# Patient Record
Sex: Female | Born: 1935
Health system: Southern US, Community
[De-identification: ages and names within clinical notes are randomized; demographics above are authoritative.]

## PROBLEM LIST (undated history)

## (undated) DIAGNOSIS — K219 Gastro-esophageal reflux disease without esophagitis: Secondary | ICD-10-CM

## (undated) DIAGNOSIS — I2510 Atherosclerotic heart disease of native coronary artery without angina pectoris: Secondary | ICD-10-CM

## (undated) DIAGNOSIS — G459 Transient cerebral ischemic attack, unspecified: Secondary | ICD-10-CM

## (undated) DIAGNOSIS — E7800 Pure hypercholesterolemia, unspecified: Secondary | ICD-10-CM

## (undated) DIAGNOSIS — E785 Hyperlipidemia, unspecified: Secondary | ICD-10-CM

## (undated) DIAGNOSIS — I6930 Unspecified sequelae of cerebral infarction: Secondary | ICD-10-CM

## (undated) DIAGNOSIS — M1990 Unspecified osteoarthritis, unspecified site: Secondary | ICD-10-CM

## (undated) DIAGNOSIS — I219 Acute myocardial infarction, unspecified: Secondary | ICD-10-CM

## (undated) DIAGNOSIS — E1121 Type 2 diabetes mellitus with diabetic nephropathy: Secondary | ICD-10-CM

## (undated) DIAGNOSIS — I10 Essential (primary) hypertension: Secondary | ICD-10-CM

## (undated) DIAGNOSIS — I639 Cerebral infarction, unspecified: Secondary | ICD-10-CM

## (undated) DIAGNOSIS — E119 Type 2 diabetes mellitus without complications: Secondary | ICD-10-CM

## (undated) DIAGNOSIS — I4891 Unspecified atrial fibrillation: Secondary | ICD-10-CM

## (undated) HISTORY — DX: Transient cerebral ischemic attack, unspecified: G45.9

## (undated) HISTORY — PX: HIP ARTHROPLASTY: SHX981

## (undated) HISTORY — DX: Unspecified sequelae of cerebral infarction: I69.30

## (undated) HISTORY — DX: Unspecified atrial fibrillation: I48.91

## (undated) HISTORY — DX: Type 2 diabetes mellitus with diabetic nephropathy: E11.21

## (undated) HISTORY — DX: Hyperlipidemia, unspecified: E78.5

## (undated) HISTORY — DX: Atherosclerotic heart disease of native coronary artery without angina pectoris: I25.10

## (undated) HISTORY — DX: Gastro-esophageal reflux disease without esophagitis: K21.9

---

## 2004-01-20 ENCOUNTER — Other Ambulatory Visit: Payer: Self-pay

## 2004-12-23 ENCOUNTER — Ambulatory Visit: Payer: Self-pay | Admitting: Unknown Physician Specialty

## 2005-03-11 ENCOUNTER — Inpatient Hospital Stay: Payer: Self-pay | Admitting: Internal Medicine

## 2005-05-04 ENCOUNTER — Ambulatory Visit: Payer: Self-pay | Admitting: Specialist

## 2005-05-16 ENCOUNTER — Inpatient Hospital Stay: Payer: Self-pay | Admitting: Internal Medicine

## 2005-06-13 ENCOUNTER — Ambulatory Visit: Payer: Self-pay | Admitting: Specialist

## 2005-06-30 ENCOUNTER — Ambulatory Visit: Payer: Self-pay | Admitting: Specialist

## 2005-07-11 ENCOUNTER — Ambulatory Visit: Payer: Self-pay | Admitting: Specialist

## 2005-07-19 ENCOUNTER — Ambulatory Visit: Payer: Self-pay | Admitting: Specialist

## 2005-08-02 ENCOUNTER — Ambulatory Visit: Payer: Self-pay | Admitting: Specialist

## 2006-11-13 ENCOUNTER — Ambulatory Visit: Payer: Self-pay | Admitting: Internal Medicine

## 2007-04-15 ENCOUNTER — Ambulatory Visit: Payer: Self-pay | Admitting: Specialist

## 2008-01-02 ENCOUNTER — Ambulatory Visit: Payer: Self-pay | Admitting: Internal Medicine

## 2009-01-12 ENCOUNTER — Ambulatory Visit: Payer: Self-pay | Admitting: Internal Medicine

## 2013-02-02 ENCOUNTER — Ambulatory Visit: Payer: Self-pay | Admitting: Physician Assistant

## 2013-02-04 ENCOUNTER — Ambulatory Visit: Payer: Self-pay | Admitting: Family Medicine

## 2014-07-10 DIAGNOSIS — E785 Hyperlipidemia, unspecified: Secondary | ICD-10-CM | POA: Insufficient documentation

## 2014-07-10 DIAGNOSIS — I10 Essential (primary) hypertension: Secondary | ICD-10-CM | POA: Insufficient documentation

## 2014-07-10 DIAGNOSIS — I2510 Atherosclerotic heart disease of native coronary artery without angina pectoris: Secondary | ICD-10-CM

## 2014-07-10 HISTORY — DX: Hyperlipidemia, unspecified: E78.5

## 2014-07-10 HISTORY — DX: Atherosclerotic heart disease of native coronary artery without angina pectoris: I25.10

## 2015-03-22 DIAGNOSIS — E119 Type 2 diabetes mellitus without complications: Secondary | ICD-10-CM | POA: Diagnosis not present

## 2015-03-29 DIAGNOSIS — E119 Type 2 diabetes mellitus without complications: Secondary | ICD-10-CM | POA: Diagnosis not present

## 2015-03-29 DIAGNOSIS — I10 Essential (primary) hypertension: Secondary | ICD-10-CM | POA: Diagnosis not present

## 2015-03-29 DIAGNOSIS — I2510 Atherosclerotic heart disease of native coronary artery without angina pectoris: Secondary | ICD-10-CM | POA: Diagnosis not present

## 2015-06-29 DIAGNOSIS — E119 Type 2 diabetes mellitus without complications: Secondary | ICD-10-CM | POA: Diagnosis not present

## 2015-07-05 DIAGNOSIS — E780 Pure hypercholesterolemia: Secondary | ICD-10-CM | POA: Diagnosis not present

## 2015-07-05 DIAGNOSIS — E119 Type 2 diabetes mellitus without complications: Secondary | ICD-10-CM | POA: Diagnosis not present

## 2015-07-05 DIAGNOSIS — I2510 Atherosclerotic heart disease of native coronary artery without angina pectoris: Secondary | ICD-10-CM | POA: Diagnosis not present

## 2015-07-05 DIAGNOSIS — I10 Essential (primary) hypertension: Secondary | ICD-10-CM | POA: Diagnosis not present

## 2015-07-05 DIAGNOSIS — K219 Gastro-esophageal reflux disease without esophagitis: Secondary | ICD-10-CM

## 2015-07-05 HISTORY — DX: Gastro-esophageal reflux disease without esophagitis: K21.9

## 2015-07-16 DIAGNOSIS — H26492 Other secondary cataract, left eye: Secondary | ICD-10-CM | POA: Diagnosis not present

## 2015-08-13 DIAGNOSIS — H26492 Other secondary cataract, left eye: Secondary | ICD-10-CM | POA: Diagnosis not present

## 2015-09-29 DIAGNOSIS — E119 Type 2 diabetes mellitus without complications: Secondary | ICD-10-CM | POA: Diagnosis not present

## 2015-10-06 DIAGNOSIS — I10 Essential (primary) hypertension: Secondary | ICD-10-CM | POA: Diagnosis not present

## 2015-10-06 DIAGNOSIS — E1340 Other specified diabetes mellitus with diabetic neuropathy, unspecified: Secondary | ICD-10-CM | POA: Diagnosis not present

## 2015-10-06 DIAGNOSIS — E119 Type 2 diabetes mellitus without complications: Secondary | ICD-10-CM | POA: Diagnosis not present

## 2015-10-06 DIAGNOSIS — Z23 Encounter for immunization: Secondary | ICD-10-CM | POA: Diagnosis not present

## 2015-10-06 DIAGNOSIS — I2510 Atherosclerotic heart disease of native coronary artery without angina pectoris: Secondary | ICD-10-CM | POA: Diagnosis not present

## 2016-01-07 DIAGNOSIS — E119 Type 2 diabetes mellitus without complications: Secondary | ICD-10-CM | POA: Diagnosis not present

## 2016-01-14 DIAGNOSIS — E1121 Type 2 diabetes mellitus with diabetic nephropathy: Secondary | ICD-10-CM

## 2016-01-14 DIAGNOSIS — E7800 Pure hypercholesterolemia, unspecified: Secondary | ICD-10-CM | POA: Diagnosis not present

## 2016-01-14 DIAGNOSIS — I10 Essential (primary) hypertension: Secondary | ICD-10-CM | POA: Diagnosis not present

## 2016-01-14 DIAGNOSIS — Z0001 Encounter for general adult medical examination with abnormal findings: Secondary | ICD-10-CM | POA: Diagnosis not present

## 2016-01-14 DIAGNOSIS — E1140 Type 2 diabetes mellitus with diabetic neuropathy, unspecified: Secondary | ICD-10-CM | POA: Insufficient documentation

## 2016-01-14 DIAGNOSIS — N179 Acute kidney failure, unspecified: Secondary | ICD-10-CM | POA: Diagnosis not present

## 2016-01-14 DIAGNOSIS — I2510 Atherosclerotic heart disease of native coronary artery without angina pectoris: Secondary | ICD-10-CM | POA: Diagnosis not present

## 2016-01-14 DIAGNOSIS — E875 Hyperkalemia: Secondary | ICD-10-CM | POA: Diagnosis not present

## 2016-01-14 HISTORY — DX: Type 2 diabetes mellitus with diabetic nephropathy: E11.21

## 2016-01-21 DIAGNOSIS — E1121 Type 2 diabetes mellitus with diabetic nephropathy: Secondary | ICD-10-CM | POA: Diagnosis not present

## 2016-03-27 ENCOUNTER — Emergency Department: Payer: Commercial Managed Care - HMO

## 2016-03-27 ENCOUNTER — Inpatient Hospital Stay
Admission: EM | Admit: 2016-03-27 | Discharge: 2016-03-29 | DRG: 065 | Disposition: A | Discharge status: Home Health | Payer: Commercial Managed Care - HMO | Attending: Internal Medicine | Admitting: Internal Medicine

## 2016-03-27 ENCOUNTER — Encounter: Payer: Self-pay | Admitting: Emergency Medicine

## 2016-03-27 DIAGNOSIS — I4891 Unspecified atrial fibrillation: Secondary | ICD-10-CM | POA: Diagnosis not present

## 2016-03-27 DIAGNOSIS — I63412 Cerebral infarction due to embolism of left middle cerebral artery: Secondary | ICD-10-CM | POA: Diagnosis not present

## 2016-03-27 DIAGNOSIS — R29701 NIHSS score 1: Secondary | ICD-10-CM | POA: Diagnosis not present

## 2016-03-27 DIAGNOSIS — R531 Weakness: Secondary | ICD-10-CM | POA: Diagnosis not present

## 2016-03-27 DIAGNOSIS — I10 Essential (primary) hypertension: Secondary | ICD-10-CM | POA: Diagnosis not present

## 2016-03-27 DIAGNOSIS — E7800 Pure hypercholesterolemia, unspecified: Secondary | ICD-10-CM | POA: Diagnosis present

## 2016-03-27 DIAGNOSIS — G459 Transient cerebral ischemic attack, unspecified: Secondary | ICD-10-CM

## 2016-03-27 DIAGNOSIS — Z96649 Presence of unspecified artificial hip joint: Secondary | ICD-10-CM | POA: Diagnosis present

## 2016-03-27 DIAGNOSIS — Z8249 Family history of ischemic heart disease and other diseases of the circulatory system: Secondary | ICD-10-CM

## 2016-03-27 DIAGNOSIS — M1990 Unspecified osteoarthritis, unspecified site: Secondary | ICD-10-CM | POA: Diagnosis present

## 2016-03-27 DIAGNOSIS — Z833 Family history of diabetes mellitus: Secondary | ICD-10-CM

## 2016-03-27 DIAGNOSIS — Z79899 Other long term (current) drug therapy: Secondary | ICD-10-CM

## 2016-03-27 DIAGNOSIS — E860 Dehydration: Secondary | ICD-10-CM | POA: Diagnosis not present

## 2016-03-27 DIAGNOSIS — E119 Type 2 diabetes mellitus without complications: Secondary | ICD-10-CM | POA: Diagnosis present

## 2016-03-27 DIAGNOSIS — G8191 Hemiplegia, unspecified affecting right dominant side: Secondary | ICD-10-CM | POA: Diagnosis present

## 2016-03-27 DIAGNOSIS — I63332 Cerebral infarction due to thrombosis of left posterior cerebral artery: Secondary | ICD-10-CM | POA: Diagnosis not present

## 2016-03-27 DIAGNOSIS — I63532 Cerebral infarction due to unspecified occlusion or stenosis of left posterior cerebral artery: Principal | ICD-10-CM | POA: Diagnosis present

## 2016-03-27 DIAGNOSIS — M79604 Pain in right leg: Secondary | ICD-10-CM | POA: Diagnosis not present

## 2016-03-27 DIAGNOSIS — E785 Hyperlipidemia, unspecified: Secondary | ICD-10-CM | POA: Diagnosis not present

## 2016-03-27 DIAGNOSIS — I6523 Occlusion and stenosis of bilateral carotid arteries: Secondary | ICD-10-CM | POA: Diagnosis not present

## 2016-03-27 DIAGNOSIS — Z7984 Long term (current) use of oral hypoglycemic drugs: Secondary | ICD-10-CM | POA: Diagnosis not present

## 2016-03-27 HISTORY — DX: Unspecified osteoarthritis, unspecified site: M19.90

## 2016-03-27 HISTORY — DX: Type 2 diabetes mellitus without complications: E11.9

## 2016-03-27 HISTORY — DX: Essential (primary) hypertension: I10

## 2016-03-27 HISTORY — DX: Pure hypercholesterolemia, unspecified: E78.00

## 2016-03-27 LAB — BASIC METABOLIC PANEL
ANION GAP: 7 (ref 5–15)
BUN: 50 mg/dL — ABNORMAL HIGH (ref 6–20)
CHLORIDE: 108 mmol/L (ref 101–111)
CO2: 24 mmol/L (ref 22–32)
CREATININE: 1.73 mg/dL — AB (ref 0.44–1.00)
Calcium: 9.5 mg/dL (ref 8.9–10.3)
GFR calc non Af Amer: 25 mL/min — ABNORMAL LOW (ref >60)
GFR, EST AFRICAN AMERICAN: 29 mL/min — AB (ref >60)
Glucose, Bld: 174 mg/dL — ABNORMAL HIGH (ref 65–99)
Potassium: 3.6 mmol/L (ref 3.5–5.1)
SODIUM: 139 mmol/L (ref 135–145)

## 2016-03-27 LAB — CBC
HCT: 35.3 % (ref 35.0–47.0)
HEMOGLOBIN: 11.9 g/dL — AB (ref 12.0–16.0)
MCH: 29.8 pg (ref 26.0–34.0)
MCHC: 33.8 g/dL (ref 32.0–36.0)
MCV: 88.4 fL (ref 80.0–100.0)
PLATELETS: 213 10*3/uL (ref 150–440)
RBC: 4 MIL/uL (ref 3.80–5.20)
RDW: 13.8 % (ref 11.5–14.5)
WBC: 9.7 10*3/uL (ref 3.6–11.0)

## 2016-03-27 LAB — APTT: APTT: 35 s (ref 24–36)

## 2016-03-27 LAB — PROTIME-INR
INR: 1.01
Prothrombin Time: 13.5 seconds (ref 11.4–15.0)

## 2016-03-27 NOTE — ED Provider Notes (Signed)
[facility] Emergency Department Provider Note  ____________________________________________  Time seen: Approximately 11:06 PM  I have reviewed the triage vital signs and the nursing notes.   HISTORY  Chief Complaint Extremity Weakness    HPI Robin Morales is a 82 y.o. female with a past medical history that includes diabetes, hypertension, hyperlipidemia as well as a prior right sided hip surgerywho presents approximately 2 hours after acute onset of right-sided weakness and numbness.  She reports that she was watching TV with when she tried to get up to get ready for bed and discover that she could not move her right arm or her right leg.  She reports that it felt like "nothing was there" and that she was not able to move the right leg at all and could only minimally move the right arm.  He did fall over his results but her daughter caught her and helped her.  Over a short period of time (minutes) the patient's symptoms began improving.  By the time EMS arrived her right upper extremity felt normal but the right lower extremity was still slightly weak and numb.  Currently in the emergency department she said that below the knee down feels just a very tiny bit numb but otherwise feels normal and she can move it again at her baseline.  She denies headache, shortness of breath, fever/chills, chest pain, shortness of breath, abdominal pain, dysuria, nausea/vomiting/diarrhea.  She has no history of cardiac arrhythmia, no history of CVA/TIA, and takes no blood thinners.    Past Medical History  Diagnosis Date  . Diabetes mellitus without complication ([place])   . Hypertension   . Arthritis   . Hypercholesterolemia     Patient Active Problem List   Diagnosis Date Noted  . A-fib ([place]) 03/28/2016  . TIA (transient ischemic attack) 03/28/2016    Past Surgical History  Procedure Laterality Date  . Hip arthroplasty      No current outpatient prescriptions on  file.  Allergies Review of patient's allergies indicates no known allergies.  Family History  Problem Relation Age of Onset  . Hypertension Daughter   . Diabetes Sister     Social History Social History  Substance Use Topics  . Smoking status: Never Smoker   . Smokeless tobacco: None  . Alcohol Use: No    Review of Systems Constitutional: No fever/chills Eyes: No visual changes. ENT: No sore throat. Cardiovascular: Denies chest pain. Respiratory: Denies shortness of breath. Gastrointestinal: No abdominal pain.  No nausea, no vomiting.  No diarrhea.  No constipation. Genitourinary: Negative for dysuria. Musculoskeletal: Negative for back pain. Skin: Negative for rash. Neurological: Negative for headaches.  Numbness and weakness in the right upper extremity as well as the right lower extremity which is significantly improved to only be slight numbness with normal movement of the right lower extremity below the knee Psychiatric:normal  10-point ROS otherwise negative.  ____________________________________________   PHYSICAL EXAM:  VITAL SIGNS: ED Triage Vitals  Enc Vitals Group     BP 03/27/16 2238 183/74 mmHg     Pulse Rate 03/27/16 2238 78     Resp 03/27/16 2238 20     Temp 03/27/16 2238 98.3 F (36.8 C)     Temp Source 03/27/16 2238 Oral     SpO2 03/27/16 2238 94 %     Weight --      Height --      Head Cir --      Peak Flow --  Pain Score 03/27/16 2216 0     Pain Loc --      Pain Edu? --      Excl. in [place]? --     Constitutional: Alert and oriented. Well appearing and in no acute distress. Eyes: Conjunctivae are normal. PERRL. EOMI. Head: Atraumatic. Nose: No congestion/rhinnorhea. Mouth/Throat: Mucous membranes are moist.  Oropharynx non-erythematous. Neck: No stridor.  No meningeal signs.   Cardiovascular: Normal rate, irregular rhythm. Good peripheral circulation. Grossly normal heart sounds.   Respiratory: Normal respiratory effort.  No  retractions. Lungs CTAB. Gastrointestinal: Soft and nontender. No distention.  Musculoskeletal: No lower extremity tenderness nor edema. No gross deformities of extremities. Neurologic:  Normal speech and language. The patient has limited range of motion on the right compared to the left due to her right hip replacement but her daughters and the patient herself reports that she is currently at her baseline.  Gait not tested due to the patient's subjective instability.  She has no gross cranial nerve deficits including no aphasia, no facial droop, no numbness or tingling on the face. Skin:  Skin is warm, dry and intact. No rash noted. Psychiatric: Mood and affect are normal. Speech and behavior are normal.  ____________________________________________   LABS (all labs ordered are listed, but only abnormal results are displayed)  Labs Reviewed  BASIC METABOLIC PANEL - Abnormal; Notable for the following:    Glucose, Bld 174 (*)    BUN 50 (*)    Creatinine, Ser 1.73 (*)    GFR calc non Af Amer 25 (*)    GFR calc Af Amer 29 (*)    All other components within normal limits  CBC - Abnormal; Notable for the following:    Hemoglobin 11.9 (*)    All other components within normal limits  URINALYSIS COMPLETEWITH MICROSCOPIC ([facility] ONLY) - Abnormal; Notable for the following:    Color, Urine STRAW (*)    APPearance CLEAR (*)    Leukocytes, UA TRACE (*)    Bacteria, UA FEW (*)    Squamous Epithelial / LPF 0-5 (*)    All other components within normal limits  GLUCOSE, CAPILLARY - Abnormal; Notable for the following:    Glucose-Capillary 113 (*)    All other components within normal limits  APTT  PROTIME-INR  TROPONIN I  HEPARIN LEVEL (UNFRACTIONATED)  HEMOGLOBIN A1C  LIPID PANEL   ____________________________________________  EKG  ED ECG REPORT I, Malky Rudzinski, the attending physician, personally viewed and interpreted this ECG.   Date: 03/27/2016  EKG Time: 22:41  Rate: 81   Rhythm: atrial fibrillation, rate 81  Axis: normal  Intervals:normal  ST&T Change: No evidence of acute ischemia  ____________________________________________  RADIOLOGY   Ct Head Wo Contrast  03/27/2016  CLINICAL DATA:  Right-sided weakness. EXAM: CT HEAD WITHOUT CONTRAST TECHNIQUE: Contiguous axial images were obtained from the base of the skull through the vertex without intravenous contrast. COMPARISON:  None. FINDINGS: The brain demonstrates no evidence of hemorrhage, infarction, edema, mass effect, extra-axial fluid collection, hydrocephalus or mass lesion. The skull is unremarkable. IMPRESSION: Normal head CT.  No acute findings. Electronically Signed   By: Aletta Edouard M.D.   On: 03/27/2016 23:55    ____________________________________________   PROCEDURES  Procedure(s) performed: None  Critical Care performed: No ____________________________________________   INITIAL IMPRESSION / ASSESSMENT AND PLAN / ED COURSE  Pertinent labs & imaging results that were available during my care of the patient were reviewed by me and considered in my medical  decision making (see chart for details).  NIH Stroke Scale  Interval: Baseline Time: 11:31 PM Person Administering Scale: Lenord Fralix  Administer stroke scale items in the order listed. Record performance in each category after each subscale exam. Do not go back and change scores. Follow directions provided for each exam technique. Scores should reflect what the patient does, not what the clinician thinks the patient can do. The clinician should record answers while administering the exam and work quickly. Except where indicated, the patient should not be coached (i.e., repeated requests to patient to make a special effort).   1a  Level of consciousness: 0=alert; keenly responsive  1b. LOC questions:  0=Performs both tasks correctly  1c. LOC commands: 0=Performs both tasks correctly  2.  Best Gaze: 0=normal  3.  Visual:  0=No visual loss  4. Facial Palsy: 0=Normal symmetric movement  5a.  Motor left arm: 0=No drift, limb holds 90 (or 45) degrees for full 10 seconds  5b.  Motor right arm: 0=No drift, limb holds 90 (or 45) degrees for full 10 seconds  6a. motor left leg: 0=No drift, limb holds 90 (or 45) degrees for full 10 seconds  6b  Motor right leg:  1=Drift, limb holds 90 (or 45) degrees but drifts down before full 10 seconds: does not hit bed  7. Limb Ataxia: 0=Absent  8.  Sensory: 0=Normal; no sensory loss  9. Best Language:  0=No aphasia, normal  10. Dysarthria: 0=Normal  11. Extinction and Inattention: 0=No abnormality  12. Distal motor function: 0=Normal   Total:   1  Note:  I believe that the patient's right motor deficit is due to chronic issues with a hip replacement rather than a new neurological deficit.  The patient presented I EMS with an NIH stroke scale of 0-1 according to the nurses and given the minimal symptoms was not made a code stroke.  After I evaluated her immediately upon my arrival for the start of my shift and got the full history, I do believe that she had a CVA/TIA but she currently has no acute deficit and she improved rapidly.  This is a contraindication for TPA and I do not believe she would be a good candidate for it.  I discussed this with the daughters and explained why do not think it is necessary to call her a "code stroke" or even discuss tPA further.  I explained the risks and benefits and they completely understand and agree with my assessment.   (Note that documentation was delayed due to multiple ED patients requiring immediate care.)   CT was unremarkable.  I admitted the patient to the hospitalist for TIA and stroke risk assessment.  Her neurological symptoms have completely resolved at this time.   ____________________________________________  FINAL CLINICAL IMPRESSION(S) / ED DIAGNOSES  Final diagnoses:  Transient cerebral ischemia, unspecified transient  cerebral ischemia type  TIA (transient ischemic attack)      NEW MEDICATIONS STARTED DURING THIS VISIT:  Current Discharge Medication List        Note:  This document was prepared using Dragon voice recognition software and may include unintentional dictation errors.   Hinda Kehr, MD 03/28/16 [phone]

## 2016-03-27 NOTE — ED Notes (Addendum)
Pt from home via EMS for right sided weakness that began about 2100. Pt states she has right less numbness below the knee now when she tried to get up tonight. 208/120 bp for EMS. Pt denies headache,n/v. Pt A&O . Hx of MI

## 2016-03-27 NOTE — ED Notes (Signed)
Pt family pt was having right sided numbness from shoulder down to RLE.

## 2016-03-28 ENCOUNTER — Inpatient Hospital Stay
Admit: 2016-03-28 | Discharge: 2016-03-28 | Disposition: A | Discharge status: Home / Self Care | Payer: Commercial Managed Care - HMO | Attending: Internal Medicine | Admitting: Internal Medicine

## 2016-03-28 ENCOUNTER — Inpatient Hospital Stay: Payer: Commercial Managed Care - HMO

## 2016-03-28 ENCOUNTER — Encounter: Payer: Self-pay | Admitting: Internal Medicine

## 2016-03-28 DIAGNOSIS — G8191 Hemiplegia, unspecified affecting right dominant side: Secondary | ICD-10-CM | POA: Diagnosis present

## 2016-03-28 DIAGNOSIS — I10 Essential (primary) hypertension: Secondary | ICD-10-CM | POA: Diagnosis present

## 2016-03-28 DIAGNOSIS — Z833 Family history of diabetes mellitus: Secondary | ICD-10-CM | POA: Diagnosis not present

## 2016-03-28 DIAGNOSIS — I63532 Cerebral infarction due to unspecified occlusion or stenosis of left posterior cerebral artery: Secondary | ICD-10-CM | POA: Diagnosis present

## 2016-03-28 DIAGNOSIS — E785 Hyperlipidemia, unspecified: Secondary | ICD-10-CM | POA: Diagnosis present

## 2016-03-28 DIAGNOSIS — Z96649 Presence of unspecified artificial hip joint: Secondary | ICD-10-CM | POA: Diagnosis present

## 2016-03-28 DIAGNOSIS — I4891 Unspecified atrial fibrillation: Secondary | ICD-10-CM | POA: Diagnosis present

## 2016-03-28 DIAGNOSIS — Z8249 Family history of ischemic heart disease and other diseases of the circulatory system: Secondary | ICD-10-CM | POA: Diagnosis not present

## 2016-03-28 DIAGNOSIS — Z7984 Long term (current) use of oral hypoglycemic drugs: Secondary | ICD-10-CM | POA: Diagnosis not present

## 2016-03-28 DIAGNOSIS — G459 Transient cerebral ischemic attack, unspecified: Secondary | ICD-10-CM | POA: Diagnosis not present

## 2016-03-28 DIAGNOSIS — E7800 Pure hypercholesterolemia, unspecified: Secondary | ICD-10-CM | POA: Diagnosis present

## 2016-03-28 DIAGNOSIS — E860 Dehydration: Secondary | ICD-10-CM | POA: Diagnosis present

## 2016-03-28 DIAGNOSIS — Z79899 Other long term (current) drug therapy: Secondary | ICD-10-CM | POA: Diagnosis not present

## 2016-03-28 DIAGNOSIS — E119 Type 2 diabetes mellitus without complications: Secondary | ICD-10-CM | POA: Diagnosis present

## 2016-03-28 DIAGNOSIS — M1990 Unspecified osteoarthritis, unspecified site: Secondary | ICD-10-CM | POA: Diagnosis present

## 2016-03-28 HISTORY — DX: Unspecified atrial fibrillation: I48.91

## 2016-03-28 HISTORY — DX: Transient cerebral ischemic attack, unspecified: G45.9

## 2016-03-28 LAB — GLUCOSE, CAPILLARY
Glucose-Capillary: 113 mg/dL — ABNORMAL HIGH (ref 65–99)
Glucose-Capillary: 103 mg/dL — ABNORMAL HIGH (ref 65–99)
Glucose-Capillary: 93 mg/dL (ref 65–99)
Glucose-Capillary: 112 mg/dL — ABNORMAL HIGH (ref 65–99)

## 2016-03-28 LAB — HEPARIN LEVEL (UNFRACTIONATED)

## 2016-03-28 LAB — HEMOGLOBIN A1C: Hgb A1c MFr Bld: 5.8 % (ref 4.0–6.0)

## 2016-03-28 LAB — URINALYSIS COMPLETE WITH MICROSCOPIC (ARMC ONLY)
Bilirubin Urine: NEGATIVE
Glucose, UA: NEGATIVE mg/dL
HGB URINE DIPSTICK: NEGATIVE
Ketones, ur: NEGATIVE mg/dL
Nitrite: NEGATIVE
PROTEIN: NEGATIVE mg/dL
SPECIFIC GRAVITY, URINE: 1.01 (ref 1.005–1.030)
pH: 6 (ref 5.0–8.0)

## 2016-03-28 LAB — TROPONIN I

## 2016-03-28 LAB — LIPID PANEL
CHOL/HDL RATIO: 3.3 ratio
CHOLESTEROL: 163 mg/dL (ref 0–200)
HDL: 50 mg/dL (ref >40)
LDL CALC: 82 mg/dL (ref 0–99)
TRIGLYCERIDES: 156 mg/dL — AB (ref <150)
VLDL: 31 mg/dL (ref 0–40)

## 2016-03-28 MED ORDER — INSULIN ASPART 100 UNIT/ML ~~LOC~~ SOLN: 0.0000 [IU] | Freq: Every day | SUBCUTANEOUS | Status: DC

## 2016-03-28 MED ORDER — APIXABAN 5 MG PO TABS
2.5000 mg | ORAL_TABLET | Freq: Two times a day (BID) | ORAL | Status: DC
  Administered 2016-03-28: 23:00:00 2.5 mg via ORAL
  Filled 2016-03-28: qty 1

## 2016-03-28 MED ORDER — SODIUM BICARBONATE 650 MG PO TABS
325.0000 mg | ORAL_TABLET | Freq: Four times a day (QID) | ORAL | Status: DC
  Administered 2016-03-28 – 2016-03-29 (×4): 325 mg via ORAL
  Filled 2016-03-28 (×4): qty 1

## 2016-03-28 MED ORDER — DEXTROSE-NACL 5-0.9 % IV SOLN: INTRAVENOUS | Status: DC

## 2016-03-28 MED ORDER — ASPIRIN 81 MG PO CHEW
324.0000 mg | CHEWABLE_TABLET | Freq: Once | ORAL | Status: AC
  Administered 2016-03-28: 324 mg via ORAL
  Filled 2016-03-28: qty 4

## 2016-03-28 MED ORDER — INSULIN ASPART 100 UNIT/ML ~~LOC~~ SOLN: 0.0000 [IU] | Freq: Three times a day (TID) | SUBCUTANEOUS | Status: DC

## 2016-03-28 MED ORDER — ACETAMINOPHEN 325 MG PO TABS: 650.0000 mg | ORAL_TABLET | ORAL | Status: DC | PRN

## 2016-03-28 MED ORDER — ASPIRIN 300 MG RE SUPP: 300.0000 mg | Freq: Every day | RECTAL | Status: DC

## 2016-03-28 MED ORDER — SODIUM CHLORIDE 0.9 % IV SOLN
INTRAVENOUS | Status: DC
  Administered 2016-03-28: 1000 mL via INTRAVENOUS
  Administered 2016-03-28 – 2016-03-29 (×2): via INTRAVENOUS

## 2016-03-28 MED ORDER — ACETAMINOPHEN 650 MG RE SUPP: 650.0000 mg | RECTAL | Status: DC | PRN

## 2016-03-28 MED ORDER — HEPARIN (PORCINE) IN NACL 100-0.45 UNIT/ML-% IJ SOLN
1000.0000 [IU]/h | INTRAMUSCULAR | Status: DC
  Administered 2016-03-28: 1000 [IU]/h via INTRAVENOUS
  Filled 2016-03-28: qty 250

## 2016-03-28 MED ORDER — STROKE: EARLY STAGES OF RECOVERY BOOK
Freq: Once | Status: AC
  Administered 2016-03-28: 14:00:00

## 2016-03-28 MED ORDER — METOPROLOL TARTRATE 25 MG PO TABS
25.0000 mg | ORAL_TABLET | Freq: Two times a day (BID) | ORAL | Status: DC
  Administered 2016-03-28 – 2016-03-29 (×2): 25 mg via ORAL
  Filled 2016-03-28 (×2): qty 1

## 2016-03-28 MED ORDER — ATORVASTATIN CALCIUM 20 MG PO TABS
40.0000 mg | ORAL_TABLET | Freq: Every day | ORAL | Status: DC
  Administered 2016-03-29: 09:00:00 40 mg via ORAL
  Filled 2016-03-28: qty 2

## 2016-03-28 MED ORDER — ASPIRIN 325 MG PO TABS
325.0000 mg | ORAL_TABLET | Freq: Every day | ORAL | Status: DC
  Administered 2016-03-28: 14:00:00 325 mg via ORAL
  Filled 2016-03-28: qty 1

## 2016-03-28 MED ORDER — SENNOSIDES-DOCUSATE SODIUM 8.6-50 MG PO TABS: 1.0000 | ORAL_TABLET | Freq: Every evening | ORAL | Status: DC | PRN

## 2016-03-28 MED ORDER — SIMVASTATIN 20 MG PO TABS
20.0000 mg | ORAL_TABLET | Freq: Every day | ORAL | Status: DC
  Administered 2016-03-28: 20 mg via ORAL
  Filled 2016-03-28: qty 1

## 2016-03-28 MED ORDER — PANTOPRAZOLE SODIUM 40 MG PO TBEC
40.0000 mg | DELAYED_RELEASE_TABLET | Freq: Every day | ORAL | Status: DC
  Administered 2016-03-28 – 2016-03-29 (×2): 40 mg via ORAL
  Filled 2016-03-28 (×2): qty 1

## 2016-03-28 NOTE — Progress Notes (Signed)
[place] [facility] [place] NAME: Robin Morales    MR#:  [id number]  DATE OF BIRTH:  01-11-1926  SUBJECTIVE:  CHIEF COMPLAINT:   Chief Complaint  Patient presents with  . Extremity Weakness  no residual weakness, all family at bedside. REVIEW OF SYSTEMS:  Review of Systems  Constitutional: Negative for fever, weight loss, malaise/fatigue and diaphoresis.  HENT: Negative for ear discharge, ear pain, hearing loss, nosebleeds, sore throat and tinnitus.   Eyes: Negative for blurred vision and pain.  Respiratory: Negative for cough, hemoptysis, shortness of breath and wheezing.   Cardiovascular: Negative for chest pain, palpitations, orthopnea and leg swelling.  Gastrointestinal: Negative for heartburn, nausea, vomiting, abdominal pain, diarrhea, constipation and blood in stool.  Genitourinary: Negative for dysuria, urgency and frequency.  Musculoskeletal: Negative for myalgias and back pain.  Skin: Negative for itching and rash.  Neurological: Negative for dizziness, tingling, tremors, focal weakness, seizures, weakness and headaches.  Psychiatric/Behavioral: Negative for depression. The patient is not nervous/anxious.     DRUG ALLERGIES:  No Known Allergies VITALS:  Blood pressure 166/59, pulse 84, temperature 97.6 F (36.4 C), temperature source Oral, resp. rate 18, height 5\' 1"  (1.549 m), weight 81.149 kg (178 lb 14.4 oz), SpO2 96 %. PHYSICAL EXAMINATION:  Physical Exam  Constitutional: She is oriented to person, place, and time and well-developed, well-nourished, and in no distress.  HENT:  Head: Normocephalic and atraumatic.  Eyes: Conjunctivae and EOM are normal. Pupils are equal, round, and reactive to light.  Neck: Normal range of motion. Neck supple. No tracheal deviation present. No thyromegaly present.  Cardiovascular: Normal rate, regular rhythm and normal heart sounds.   Pulmonary/Chest: Effort normal and breath sounds  normal. No respiratory distress. She has no wheezes. She exhibits no tenderness.  Abdominal: Soft. Bowel sounds are normal. She exhibits no distension. There is no tenderness.  Musculoskeletal: Normal range of motion.  Neurological: She is alert and oriented to person, place, and time. No cranial nerve deficit.  Skin: Skin is warm and dry. No rash noted.  Psychiatric: Mood and affect normal.   LABORATORY PANEL:   CBC  Recent Labs Lab 03/27/16 2231  WBC 9.7  HGB 11.9*  HCT 35.3  PLT 213   ------------------------------------------------------------------------------------------------------------------ Chemistries   Recent Labs Lab 03/27/16 2231  NA 139  K 3.6  CL 108  CO2 24  GLUCOSE 174*  BUN 50*  CREATININE 1.73*  CALCIUM 9.5   RADIOLOGY:  Ct Head Wo Contrast  03/27/2016  CLINICAL DATA:  Right-sided weakness. EXAM: CT HEAD WITHOUT CONTRAST TECHNIQUE: Contiguous axial images were obtained from the base of the skull through the vertex without intravenous contrast. COMPARISON:  None. FINDINGS: The brain demonstrates no evidence of hemorrhage, infarction, edema, mass effect, extra-axial fluid collection, hydrocephalus or mass lesion. The skull is unremarkable. IMPRESSION: Normal head CT.  No acute findings. Electronically Signed   By: Aletta Edouard M.D.   On: 03/27/2016 23:55   Mr Brain Wo Contrast  03/28/2016  ADDENDUM REPORT: 03/28/2016 13:48 ADDENDUM: Study discussed by telephone with Neurology Dr. Manuella Ghazi On 03/28/2016 at 1335 hours. Electronically Signed   By: Genevie Ann M.D.   On: 03/28/2016 13:48  03/28/2016  CLINICAL DATA:  82 year old female with right side weakness since 2100 hours yesterday. Initial encounter. EXAM: MRI HEAD WITHOUT CONTRAST MRA HEAD WITHOUT CONTRAST TECHNIQUE: Multiplanar, multiecho pulse sequences of the brain and surrounding structures were obtained without intravenous contrast. Angiographic images of the head were  obtained using MRA technique without  contrast. COMPARISON:  Head CT without contrast 03/27/2016. FINDINGS: MRI HEAD FINDINGS Cerebral volume is within normal limits for age. There are 1 or 2 subtle small areas of restricted diffusion in the left occipital lobe, most notably the medial left PCA territory on series 5, image 20. No other restricted diffusion. Major intracranial vascular flow voids are within normal limits. Minimal to mild for age nonspecific cerebral white matter T2 and FLAIR hyperintensity. No cortical encephalomalacia. No definite chronic cerebral blood products. No midline shift, mass effect, evidence of mass lesion, ventriculomegaly, extra-axial collection or acute intracranial hemorrhage. Cervicomedullary junction and pituitary are within normal limits. Visible internal auditory structures appear normal. Mastoids are clear. Trace paranasal sinus mucosal thickening. Postoperative changes to the globes. Otherwise negative orbit and scalp soft tissues. MRA HEAD FINDINGS Antegrade flow in the posterior circulation with no distal vertebral artery stenosis. Normal PICA origins and vertebrobasilar junction. No basilar stenosis. Normal SCA and PCA origins. Suggestion of very distal left PCA flow attenuation (P3 branches). An this may correspond to an area of asymmetric T2 * signal in the left occipital lobe on series 15, image 12. Right PCA branches are within normal limits. Posterior communicating arteries are diminutive or absent. Antegrade flow in both ICA siphons moderate irregularity in both cavernous and supraclinoid ICA segments with up to mild associated stenosis. Ophthalmic artery origins remain normal. Carotid termini remain patent. MCA and ACA origins are within normal limits. Anterior communicating artery and visualized bilateral ACA branches are within normal limits. Right MCA M1 segment and visualized right MCA branches are within normal limits. Left MCA M1 segment and visualized left MCA branches are within normal limits aside  from mild irregularity at the left MCA bifurcation. IMPRESSION: 1. Small acute left PCA territory infarcts associated with distal (P3) left PCA stenosis or occlusion on MRA. 2. No associated hemorrhage or mass effect. 3. Otherwise negative for age noncontrast MRI appearance of the brain. 4. The remaining posterior circulation is negative on MRA. The anterior circulation is remarkable for moderate ICA siphon and mild left MCA atherosclerosis. Electronically Signed: By: Genevie Ann M.D. On: 03/28/2016 13:26   US Carotid Bilateral  03/28/2016  CLINICAL DATA:  TIA.  Right-sided weakness yesterday. EXAM: BILATERAL CAROTID DUPLEX ULTRASOUND TECHNIQUE: Pearline Cables scale imaging, color Doppler and duplex ultrasound were performed of bilateral carotid and vertebral arteries in the neck. COMPARISON:  None. FINDINGS: Criteria: Quantification of carotid stenosis is based on velocity parameters that correlate the residual internal carotid diameter with NASCET-based stenosis levels, using the diameter of the distal internal carotid lumen as the denominator for stenosis measurement. The following velocity measurements were obtained: RIGHT ICA:  114 cm/sec CCA:  [id number] cm/sec SYSTOLIC ICA/CCA RATIO:  0.9 DIASTOLIC ICA/CCA RATIO:  0.8 ECA:  162 cm/sec LEFT ICA:  112 cm/sec CCA:  93 cm/sec SYSTOLIC ICA/CCA RATIO:  1.2 DIASTOLIC ICA/CCA RATIO:  1.6 ECA:  124 cm/sec RIGHT CAROTID ARTERY: Small amount of echogenic plaque in the right common carotid artery without significant stenosis. Right external carotid artery is patent. Small amount of echogenic plaque in the proximal internal carotid artery. Normal waveforms and velocities in the right internal carotid artery without significant stenosis. RIGHT VERTEBRAL ARTERY: Antegrade flow and normal waveform in the right vertebral artery. LEFT CAROTID ARTERY: Heterogeneous plaque at the left carotid bulb and origin of the internal carotid artery. Heterogeneous plaque at the origin of the left external  carotid artery. Normal waveforms and velocities in left external carotid  artery. Normal waveforms and velocities in the left internal carotid artery. LEFT VERTEBRAL ARTERY: Antegrade flow and normal waveform in the left vertebral artery. IMPRESSION: Mild atherosclerotic disease in the carotid arteries bilaterally. Estimated degree of stenosis in the internal carotid arteries is less than 50% bilaterally. Patent vertebral arteries Electronically Signed   By: Markus Daft M.D.   On: 03/28/2016 12:27   Mr Jodene Nam Head/brain Wo Cm  03/28/2016  ADDENDUM REPORT: 03/28/2016 13:48 ADDENDUM: Study discussed by telephone with Neurology Dr. Manuella Ghazi On 03/28/2016 at 1335 hours. Electronically Signed   By: Genevie Ann M.D.   On: 03/28/2016 13:48  03/28/2016  CLINICAL DATA:  82 year old female with right side weakness since 2100 hours yesterday. Initial encounter. EXAM: MRI HEAD WITHOUT CONTRAST MRA HEAD WITHOUT CONTRAST TECHNIQUE: Multiplanar, multiecho pulse sequences of the brain and surrounding structures were obtained without intravenous contrast. Angiographic images of the head were obtained using MRA technique without contrast. COMPARISON:  Head CT without contrast 03/27/2016. FINDINGS: MRI HEAD FINDINGS Cerebral volume is within normal limits for age. There are 1 or 2 subtle small areas of restricted diffusion in the left occipital lobe, most notably the medial left PCA territory on series 5, image 20. No other restricted diffusion. Major intracranial vascular flow voids are within normal limits. Minimal to mild for age nonspecific cerebral white matter T2 and FLAIR hyperintensity. No cortical encephalomalacia. No definite chronic cerebral blood products. No midline shift, mass effect, evidence of mass lesion, ventriculomegaly, extra-axial collection or acute intracranial hemorrhage. Cervicomedullary junction and pituitary are within normal limits. Visible internal auditory structures appear normal. Mastoids are clear. Trace paranasal  sinus mucosal thickening. Postoperative changes to the globes. Otherwise negative orbit and scalp soft tissues. MRA HEAD FINDINGS Antegrade flow in the posterior circulation with no distal vertebral artery stenosis. Normal PICA origins and vertebrobasilar junction. No basilar stenosis. Normal SCA and PCA origins. Suggestion of very distal left PCA flow attenuation (P3 branches). An this may correspond to an area of asymmetric T2 * signal in the left occipital lobe on series 15, image 12. Right PCA branches are within normal limits. Posterior communicating arteries are diminutive or absent. Antegrade flow in both ICA siphons moderate irregularity in both cavernous and supraclinoid ICA segments with up to mild associated stenosis. Ophthalmic artery origins remain normal. Carotid termini remain patent. MCA and ACA origins are within normal limits. Anterior communicating artery and visualized bilateral ACA branches are within normal limits. Right MCA M1 segment and visualized right MCA branches are within normal limits. Left MCA M1 segment and visualized left MCA branches are within normal limits aside from mild irregularity at the left MCA bifurcation. IMPRESSION: 1. Small acute left PCA territory infarcts associated with distal (P3) left PCA stenosis or occlusion on MRA. 2. No associated hemorrhage or mass effect. 3. Otherwise negative for age noncontrast MRI appearance of the brain. 4. The remaining posterior circulation is negative on MRA. The anterior circulation is remarkable for moderate ICA siphon and mild left MCA atherosclerosis. Electronically Signed: By: Genevie Ann M.D. On: 03/28/2016 13:26   ASSESSMENT AND PLAN:  82 year old female patient with history of hypertension, hyperlipidemia, arthritis, diabetes mellitus presented to the emergency room with weakness in the right side of the body.  * Acute CVA: small left PCA territory infarct. Carotid showed no significant stenosis * New onset atrial  fibrillation: d/w cardio - she'll need full dose anticoagulation considering high CHADs score. Will start eliquis * Dehydration: NS iv * Hypertension: let it autoregulate in  acute phase, slowly bring it down, start metoprolol * Hyperlipidemia: high intensity statin * Type 2 diabetes mellitus: SSI     All the records are reviewed and case discussed with Care Management/Social Worker and cardi-neuro Management plans discussed with the patient, family and they are in agreement.  CODE STATUS: full code  TOTAL TIME TAKING CARE OF THIS PATIENT: 35 minutes.   More than 50% of the time was spent in counseling/coordination of care: YES  POSSIBLE D/C IN AM, DEPENDING ON CLINICAL CONDITION. Likely home with home health PT   [facility], Swayzie Choate M.D on 03/28/2016 at 7:03 PM  Between 7am to 6pm - Pager - [phone]  After 6pm go to [email or web address] - password [email or web address] [place] [facility]  Office  [phone]  CC: Primary care physician; Madelyn Brunner, MD  Note: This dictation was prepared with Dragon dictation along with smaller phrase technology. Any transcriptional errors that result from this process are unintentional.

## 2016-03-28 NOTE — Consult Note (Signed)
[facility]  CARDIOLOGY CONSULT NOTE  Patient ID: Robin Morales MRN: [id number] DOB/AGE: July 03, 1926 82 y.o.  Admit date: 03/27/2016 Referring Physician Dr. Manuella Ghazi Primary Physician Dr. Candie Chroman Primary Cardiologist Dr. Ubaldo Glassing Reason for Consultation afib/cva  HPI: Pt is an 82 yo female with history of dm, htn, who was admitted with acute onset of right body weakness. She presented to the er where head ct was unremarkable for bleeding or acute stroke. Brain revealed left pca distribution cva. Carotid doppler revealed less than 50% stenosis bilaterally. She was noted to be in afib with controlled vr. No previous ekg tracings have shown afib. Pt was no aware of any arrhythmia. Her neurologic symptoms have resolved. Her chads2vasc score is 2 for age greater than 3, 1 for female gender, 1 for dm, 2 for cva, 1 for htn for a total of 7. Echo is pending. Pt has no history of bleeding or frequent falls.   Review of Systems  Constitutional: Negative.   HENT: Negative.   Eyes: Negative.   Respiratory: Negative.   Cardiovascular: Negative.   Gastrointestinal: Negative.   Genitourinary: Negative.   Musculoskeletal: Negative.   Skin: Negative.   Neurological: Negative.   Endo/Heme/Allergies: Negative.   Psychiatric/Behavioral: Negative.     Past Medical History  Diagnosis Date  . Diabetes mellitus without complication ([place])   . Hypertension   . Arthritis   . Hypercholesterolemia     Family History  Problem Relation Age of Onset  . Hypertension Daughter   . Diabetes Sister     Social History   Social History  . Marital Status: Married    Spouse Name: N/A  . Number of Children: N/A  . Years of Education: N/A   Occupational History  . retired    Social History Main Topics  . Smoking status: Never Smoker   . Smokeless tobacco: Not on file  . Alcohol Use: No  . Drug Use: No  . Sexual Activity: Not on file   Other Topics Concern  . Not on  file   Social History Narrative    Past Surgical History  Procedure Laterality Date  . Hip arthroplasty       Prescriptions prior to admission  Medication Sig Dispense Refill Last Dose  . amLODipine (NORVASC) 2.5 MG tablet Take 1 tablet by mouth daily.   03/28/2016 at Unknown time  . Magnesium Hydroxide (MAGNESIA PO) Take 25 mg by mouth daily.   03/28/2016 at Unknown time  . metoprolol (LOPRESSOR) 50 MG tablet Take 1 tablet by mouth 2 (two) times daily.   03/28/2016 at Unknown time  . omeprazole (PRILOSEC) 40 MG capsule Take 1 capsule by mouth daily.   03/28/2016 at Unknown time  . simvastatin (ZOCOR) 20 MG tablet Take 1 tablet by mouth daily.   03/28/2016 at Unknown time  . sodium bicarbonate 325 MG tablet Take 325 mg by mouth 4 (four) times daily.   03/28/2016 at Unknown time  . metFORMIN (GLUCOPHAGE) 500 MG tablet Take 1 tablet by mouth 2 (two) times daily. Reported on 03/28/2016   Not Taking at Unknown time    Physical Exam: Blood pressure 166/59, pulse 84, temperature 97.6 F (36.4 C), temperature source Oral, resp. rate 18, height 5\' 1"  (1.549 m), weight 81.149 kg (178 lb 14.4 oz), SpO2 97 %.   Wt Readings from Last 1 Encounters:  03/28/16 81.149 kg (178 lb 14.4 oz)     General appearance: alert and cooperative Head: Normocephalic,  without obvious abnormality, atraumatic Resp: clear to auscultation bilaterally Cardio: irregularly irregular rhythm GI: soft, non-tender; bowel sounds normal; no masses,  no organomegaly Extremities: extremities normal, atraumatic, no cyanosis or edema Neurologic: Grossly normal  Labs:   Lab Results  Component Value Date   WBC 9.7 03/27/2016   HGB 11.9* 03/27/2016   HCT 35.3 03/27/2016   MCV 88.4 03/27/2016   PLT 213 03/27/2016    Recent Labs Lab 03/27/16 2231  NA 139  K 3.6  CL 108  CO2 24  BUN 50*  CREATININE 1.73*  CALCIUM 9.5  GLUCOSE 174*   Lab Results  Component Value Date   TROPONINI <0.03 03/27/2016      Radiology: left pca  cva per mri. No bleed or head ct EKG: afib with controlled vr  ASSESSMENT AND PLAN:  Pt with right body tia with newly noted afib. Etiology of stroke is unclear but likely secondary to her afib. The lelsion is unilateral and carotids are not critically stenosed. No absolute contraindicaitn for anticoagulation. Discussed risk and benefits of noac vs warfarin therapy. Will discontinue heparin and start on eliquis 5 bid. Rate is well controlled. Will follow up as outpatinet.  Signed: Teodoro Spray MD, [facility] 03/28/2016, 7:14 PM

## 2016-03-28 NOTE — Progress Notes (Signed)
ANTICOAGULATION CONSULT NOTE - Initial Consult  Pharmacy Consult for heparin drip Indication: atrial fibrillation  No Known Allergies  Patient Measurements: Height: 5\' 1"  (154.9 cm) Weight: 178 lb 14.4 oz (81.149 kg) IBW/kg (Calculated) : 47.8 Heparin Dosing Weight: 66kg  Vital Signs: Temp: 98.3 F (36.8 C) (04/03 2238) Temp Source: Oral (04/03 2238) BP: 139/68 mmHg (04/04 0100) Pulse Rate: 71 (04/04 0100)  Labs:  Recent Labs  03/27/16 2231  HGB 11.9*  HCT 35.3  PLT 213  APTT 35  LABPROT 13.5  INR 1.01  CREATININE 1.73*  TROPONINI <0.03    Estimated Creatinine Clearance: 22.1 mL/min (by C-G formula based on Cr of 1.73).   Medical History: Past Medical History  Diagnosis Date  . Diabetes mellitus without complication ([place])   . Hypertension   . Arthritis   . Hypercholesterolemia     Medications:    Assessment: Not on anticoagulation as outpatient per med rec.  Goal of Therapy:  Heparin level 0.3-0.7 units/ml Monitor platelets by anticoagulation protocol: Yes   Plan:  No bolus per consult instructions. Initial rate of 1000 units/hr. First anti-Xa 8 hours after start of infusion..  Dre Gamino S 03/28/2016,1:46 AM

## 2016-03-28 NOTE — Progress Notes (Addendum)
ANTICOAGULATION CONSULT NOTE - Initial Consult  Pharmacy Consult for Eliquis  Indication: atrial fibrillation  No Known Allergies  Patient Measurements: Height: 5\' 1"  (154.9 cm) Weight: 178 lb 14.4 oz (81.149 kg) IBW/kg (Calculated) : 47.8 Heparin Dosing Weight:   Vital Signs: Temp: 97.6 F (36.4 C) (04/04 1402) Temp Source: Oral (04/04 1402) BP: 166/59 mmHg (04/04 1752) Pulse Rate: 84 (04/04 1752)  Labs:  Recent Labs  03/27/16 2231 03/28/16 1401  HGB 11.9*  --   HCT 35.3  --   PLT 213  --   APTT 35  --   LABPROT 13.5  --   INR 1.01  --   HEPARINUNFRC  --  <0.10*  CREATININE 1.73*  --   TROPONINI <0.03  --     Estimated Creatinine Clearance: 22.1 mL/min (by C-G formula based on Cr of 1.73).   Medical History: Past Medical History  Diagnosis Date  . Diabetes mellitus without complication ([place])   . Hypertension   . Arthritis   . Hypercholesterolemia     Medications:  Prescriptions prior to admission  Medication Sig Dispense Refill Last Dose  . amLODipine (NORVASC) 2.5 MG tablet Take 1 tablet by mouth daily.   03/28/2016 at Unknown time  . Magnesium Hydroxide (MAGNESIA PO) Take 25 mg by mouth daily.   03/28/2016 at Unknown time  . metoprolol (LOPRESSOR) 50 MG tablet Take 1 tablet by mouth 2 (two) times daily.   03/28/2016 at Unknown time  . omeprazole (PRILOSEC) 40 MG capsule Take 1 capsule by mouth daily.   03/28/2016 at Unknown time  . simvastatin (ZOCOR) 20 MG tablet Take 1 tablet by mouth daily.   03/28/2016 at Unknown time  . sodium bicarbonate 325 MG tablet Take 325 mg by mouth 4 (four) times daily.   03/28/2016 at Unknown time  . metFORMIN (GLUCOPHAGE) 500 MG tablet Take 1 tablet by mouth 2 (two) times daily. Reported on 03/28/2016   Not Taking at Unknown time    Assessment: Pharmacy consulted to dose Eliquis in this 82 year old female with new onset Afib.  TBW = 81.1kg SrCr = 1.73  Goal of Therapy:  prophylaxis of thromboembolus   Plan:  Eliquis 5 mg PO BID  originally ordered.  Will adjust dose to  Eliquis 2.5 mg PO BID based on SrCr and age.   Deeg,Sunjai Levandoski D 03/28/2016,7:28 PM

## 2016-03-28 NOTE — Progress Notes (Signed)
ANTICOAGULATION CONSULT NOTE - Follow Up Consult  Pharmacy Consult for heparin drip Indication: atrial fibrillation  No Known Allergies  Patient Measurements: Height: 5\' 1"  (154.9 cm) Weight: 178 lb 14.4 oz (81.149 kg) IBW/kg (Calculated) : 47.8 Heparin Dosing Weight: 66kg  Vital Signs: Temp: 97.6 F (36.4 C) (04/04 1402) Temp Source: Oral (04/04 1402) BP: 166/69 mmHg (04/04 1402) Pulse Rate: 90 (04/04 1402)  Labs:  Recent Labs  03/27/16 2231 03/28/16 1401  HGB 11.9*  --   HCT 35.3  --   PLT 213  --   APTT 35  --   LABPROT 13.5  --   INR 1.01  --   HEPARINUNFRC  --  <0.10*  CREATININE 1.73*  --   TROPONINI <0.03  --     Estimated Creatinine Clearance: 22.1 mL/min (by C-G formula based on Cr of 1.73).   Medical History: Past Medical History  Diagnosis Date  . Diabetes mellitus without complication ([place])   . Hypertension   . Arthritis   . Hypercholesterolemia     Medications:  Heparin infusion at 1000 units/hr  Assessment: Patient with heparin drip infusing at 1000 units/hr.   HL at 1400 of <0.1 - Spoke with RN who stated that drip was turned off for patient to go to MRI.  Per RN, IV access lost during physical therapy and new IV site will need to be placed.   Goal of Therapy:  Heparin level 0.3-0.7 units/ml Monitor platelets by anticoagulation protocol: Yes   Plan:  HL invalid as infusion was stopped prior to obtaining at 1400.  RN to call pharmacy once new IV site if placed.  Instructed RN to restart at previous drip rate and pharmacy will need to schedule HL in 8 hours after re-initiation of drip.   Holley Kocurek G 03/28/2016,3:31 PM

## 2016-03-28 NOTE — Evaluation (Signed)
Clinical/Bedside Swallow Evaluation Patient Details  Name: Robin Morales MRN: [id number] Date of Birth: Aug 26, 1926  Today's Date: 03/28/2016 Time: SLP Start Time (ACUTE ONLY): 1000 SLP Stop Time (ACUTE ONLY): 1100 SLP Time Calculation (min) (ACUTE ONLY): 60 min  Past Medical History:  Past Medical History  Diagnosis Date  . Diabetes mellitus without complication ([place])   . Hypertension   . Arthritis   . Hypercholesterolemia    Past Surgical History:  Past Surgical History  Procedure Laterality Date  . Hip arthroplasty     HPI:  Pt is a 82 y.o. female with a known history of Diabetes mellitus, hypertension, arthritis, hyperlipidemia presented to the emergency room because of weakness in the right side of the body started around 9 PM yesterday night. Patient was lying down on the couch at home and she felt weak in the right side of the body. She also had some numbness in the right foot. No history of slurred speech. No complaints of any difficulty swallowing food. Patient never had any stroke in the past. Patient was worked up with a CT head which showed no acute intracranial abnormality. During the workup in the emergency room patient's EKG showed atrial fibrillation patient was put on the heparin drip in the emergency room. No history of any chest pain and first set of troponin is negative. In the emergency room patient was able to move the right upper and lower extremity without any difficulty. Numbness also has improved. No history of fall or head injury. No history of fever or chills or cough. Pt verbally conversive and interactive; A/O x3. Noted insight as to her swallowing challenges at home during discussion. Pt followed instruction well.    Assessment / Plan / Recommendation Clinical Impression  Pt appears at reduced risk for aspiration w/ po liquids, purees, and mech soft foods. No overt s/s of aspiration were noted w/ trials; no decline in respiratory presentation. Oral phase wfl w/  all trial consistencies. Pt fed self. Dicussed the recent choking event when drinking water standing at her refrigerator while cooking - the potential level of distraction during that time impacting her swallowing. Discussed swallowing behaviors and ways to modify behaviors and add in general aspiration precautions in order to lessen risk for aspiration during swallowing including sitting down when drinking liquids, less talking during meals, eating/drinking slowly, small sips, etc. Also discussed the recommendation to use swallow pills w/ a puree such as applesauce or Yogurt from now on. Pt and Dtr agreed.     Aspiration Risk   (reduced)    Diet Recommendation  regular diet w/ meats cut/moistened; thin liquids; general aspiration precautions  Medication Administration: Whole meds with puree    Other  Recommendations Oral Care Recommendations: Oral care BID;Patient independent with oral care;Staff/trained caregiver to provide oral care   Follow up Recommendations  None    Frequency and Duration            Prognosis Prognosis for Safe Diet Advancement: Good      Swallow Study   General Date of Onset: 03/27/16 HPI: Pt is a 82 y.o. female with a known history of Diabetes mellitus, hypertension, arthritis, hyperlipidemia presented to the emergency room because of weakness in the right side of the body started around 9 PM yesterday night. Patient was lying down on the couch at home and she felt weak in the right side of the body. She also had some numbness in the right foot. No history of slurred speech. No complaints  of any difficulty swallowing food. Patient never had any stroke in the past. Patient was worked up with a CT head which showed no acute intracranial abnormality. During the workup in the emergency room patient's EKG showed atrial fibrillation patient was put on the heparin drip in the emergency room. No history of any chest pain and first set of troponin is negative. In the emergency  room patient was able to move the right upper and lower extremity without any difficulty. Numbness also has improved. No history of fall or head injury. No history of fever or chills or cough. Pt verbally conversive and interactive; A/O x3. Noted insight as to her swallowing challenges at home during discussion. Pt followed instruction well.  Type of Study: Bedside Swallow Evaluation Previous Swallow Assessment: none Diet Prior to this Study: Regular;Thin liquids Temperature Spikes Noted: No (wbc 9.7) Respiratory Status: Room air History of Recent Intubation: No Behavior/Cognition: Alert;Cooperative;Pleasant mood Oral Cavity Assessment: Within Functional Limits;Dry (min.) Oral Care Completed by SLP: Recent completion by staff Oral Cavity - Dentition: Adequate natural dentition Vision: Functional for self-feeding Self-Feeding Abilities: Able to feed self Patient Positioning: Upright in bed Baseline Vocal Quality: Normal Volitional Cough: Strong Volitional Swallow: Able to elicit    Oral/Motor/Sensory Function Overall Oral Motor/Sensory Function: Within functional limits   Ice Chips Ice chips: Within functional limits Presentation: Spoon (fed; 3 trials)   Thin Liquid Thin Liquid: Within functional limits Presentation: Cup;Self Fed (~3-4 ozs total ) Other Comments: does not use straws at home    Nectar Thick Nectar Thick Liquid: Not tested   Honey Thick Honey Thick Liquid: Not tested   Puree Puree: Within functional limits Presentation: Self Fed;Spoon (fed; 3 trials)   Solid   GO   Solid: Within functional limits Presentation: Spoon (2 trials) Other Comments: did not want much po's before going for tests       Orinda Kenner, MS, CCC-SLP  Watson,Katherine 03/28/2016,3:30 PM

## 2016-03-28 NOTE — Evaluation (Signed)
Physical Therapy Evaluation Patient Details Name: Robin Morales MRN: [id number] DOB: Mar 22, 1926 Today's Date: 03/28/2016   History of Present Illness  presented to ER with acute onset of weakness, numbness to R hemi-body; admitted with TIA and new onset a-fib (started on heparin drip).  MRI significant for L PCA infarcts associated wtih distal L PCA stenosis.  Clinical Impression  Upon evaluation, patient alert and oriented to all information; follows commands, but demonstrates mild impulsivity with functional activities.  Bilat UE/LE strength and ROM appear grossly WFL and symmetrical; no significant focal weakness or sensory deficit appreciated.  No visual deficit obvious.  Able to complete bed mobility, sit/stand, basic transfers and gait (36') with RW, cga/min assist.  Slightly impulsive, but no overt buckling or LOB.  Higher-level balance deficits evident; will assess formal balance measure next session as appropriate. Would benefit from skilled PT to address above deficits and promote optimal return to PLOF; Recommend transition to HHPT and 24 hour sup/assist upon discharge from acute hospitalization..    Follow Up Recommendations Home health PT    Equipment Recommendations  Rolling walker with 5" wheels    Recommendations for Other Services       Precautions / Restrictions Precautions Precautions: Fall Restrictions Weight Bearing Restrictions: No      Mobility  Bed Mobility Overal bed mobility: Needs Assistance Bed Mobility: Supine to Sit;Sit to Supine     Supine to sit: Modified independent (Device/Increase time) Sit to supine: Min guard      Transfers Overall transfer level: Needs assistance Equipment used: Rolling walker (2 wheeled) Transfers: Sit to/from Stand Sit to Stand: Min guard;Min assist            Ambulation/Gait Ambulation/Gait assistance: Min guard;Min assist Ambulation Distance (Feet): 80 Feet Assistive device: Rolling walker (2 wheeled)       General Gait Details: reciprocal stepping with fair step height/length; fair control of R LE, mildly antalgic.  No overt buckling or LOB; patient/family feel mobiltiy near baseline level of functional performance.  Stairs            Wheelchair Mobility    Modified Rankin (Stroke Patients Only)       Balance Overall balance assessment: Needs assistance Sitting-balance support: No upper extremity supported;Feet supported Sitting balance-Leahy Scale: Good     Standing balance support: Bilateral upper extremity supported Standing balance-Leahy Scale: Fair                               Pertinent Vitals/Pain Pain Assessment: No/denies pain    Home Living Family/patient expects to be discharged to:: Private residence Living Arrangements: Children Available Help at Discharge: Family;Available 24 hours/day Type of Home: House Home Access: Stairs to enter Entrance Stairs-Rails: None Entrance Stairs-Number of Steps: 1 Home Layout: One level Home Equipment: Walker - 4 wheels      Prior Function Level of Independence: Independent with assistive device(s)         Comments: Mod indep with ADLs, household mobility with 4WRW; does endorse single fall within previous six months (due to "LEs giving way")     Hand Dominance        Extremity/Trunk Assessment   Upper Extremity Assessment: Overall WFL for tasks assessed           Lower Extremity Assessment: Overall WFL for tasks assessed (grossly symmetrical at 4/5, denies sensory deficit)         Communication   Communication: No difficulties  Cognition Arousal/Alertness: Awake/alert Behavior During Therapy: WFL for tasks assessed/performed;Impulsive Overall Cognitive Status: Within Functional Limits for tasks assessed                      General Comments      Exercises Other Exercises Other Exercises: Toilet transfer, ambulatory with RW, cga/min assist; cuing for safety due to  impulsivity (with mild urge incontinence).  Sit/stand from standard toilet with RW, cga/close sup. Other Exercises: Visual fields appear equal/symmetrical; denies any change in vision with episode.      Assessment/Plan    PT Assessment Patient needs continued PT services  PT Diagnosis Difficulty walking;Generalized weakness   PT Problem List Decreased activity tolerance;Decreased balance;Decreased mobility;Decreased safety awareness  PT Treatment Interventions DME instruction;Gait training;Stair training;Functional mobility training;Therapeutic activities;Therapeutic exercise;Balance training;Neuromuscular re-education;Patient/family education   PT Goals (Current goals can be found in the Care Plan section) Acute Rehab PT Goals Patient Stated Goal: to return home with daughter PT Goal Formulation: With patient/family Time For Goal Achievement: 04/11/16 Potential to Achieve Goals: Good    Frequency 7X/week   Barriers to discharge        Co-evaluation               End of Session Equipment Utilized During Treatment: Gait belt Activity Tolerance: Patient tolerated treatment well Patient left: in bed;with call bell/phone within reach;with bed alarm set;with family/visitor present Nurse Communication: Mobility status    Functional Assessment Tool Used: clinical judgement Functional Limitation: Mobility: Walking and moving around Mobility: Walking and Moving Around Current Status [id number]): At least 20 percent but less than 40 percent impaired, limited or restricted Mobility: Walking and Moving Around Goal Status [phone]): At least 1 percent but less than 20 percent impaired, limited or restricted    Time: [id number] PT Time Calculation (min) (ACUTE ONLY): 24 min   Charges:   PT Evaluation $PT Eval Low Complexity: 1 Procedure PT Treatments $Therapeutic Activity: 8-22 mins   PT G Codes:   PT G-Codes **NOT FOR INPATIENT CLASS** Functional Assessment Tool Used: clinical  judgement Functional Limitation: Mobility: Walking and moving around Mobility: Walking and Moving Around Current Status [id number]): At least 20 percent but less than 40 percent impaired, limited or restricted Mobility: Walking and Moving Around Goal Status [phone]): At least 1 percent but less than 20 percent impaired, limited or restricted    Lakara Weiland H. Owens Shark, PT, DPT, NCS 03/28/2016, 5:22 PM [phone]

## 2016-03-28 NOTE — Consult Note (Signed)
Referring Physician: Manuella Ghazi    Chief Complaint: Right sided weakness  HPI: Robin Morales is an 82 y.o. female with multiple vascular risk factors who presents reporting that yesterday evening she bean to experience altered sensation that started in her RUE.  She then noted that she was unable to use the extremity.  When she attempted to rise she noted that her right leg was weak as well and that her right foot was numb.  Patient was brought to the ED at that time.  Was found to be in atrial fibrillation.  Symptoms improved and patient was started on heparin.   Initial NIHSS of 2.    Date last known well: 03/27/2016 Time last known well: Time: 21:00 tPA Given: No: Resolution of symptoms  Past Medical History  Diagnosis Date  . Diabetes mellitus without complication ([place])   . Hypertension   . Arthritis   . Hypercholesterolemia     Past Surgical History  Procedure Laterality Date  . Hip arthroplasty      Family History  Problem Relation Age of Onset  . Hypertension Daughter   . Diabetes Sister    Social History:  reports that she has never smoked. She does not have any smokeless tobacco history on file. She reports that she does not drink alcohol or use illicit drugs.  Allergies: No Known Allergies  Medications:  I have reviewed the patient's current medications. Prior to Admission:  Prescriptions prior to admission  Medication Sig Dispense Refill Last Dose  . amLODipine (NORVASC) 2.5 MG tablet Take 1 tablet by mouth daily.   03/28/2016 at Unknown time  . Magnesium Hydroxide (MAGNESIA PO) Take 25 mg by mouth daily.   03/28/2016 at Unknown time  . metoprolol (LOPRESSOR) 50 MG tablet Take 1 tablet by mouth 2 (two) times daily.   03/28/2016 at Unknown time  . omeprazole (PRILOSEC) 40 MG capsule Take 1 capsule by mouth daily.   03/28/2016 at Unknown time  . simvastatin (ZOCOR) 20 MG tablet Take 1 tablet by mouth daily.   03/28/2016 at Unknown time  . sodium bicarbonate 325 MG tablet Take  325 mg by mouth 4 (four) times daily.   03/28/2016 at Unknown time  . metFORMIN (GLUCOPHAGE) 500 MG tablet Take 1 tablet by mouth 2 (two) times daily. Reported on 03/28/2016   Not Taking at Unknown time   Scheduled: .  stroke: mapping our early stages of recovery book   Does not apply Once  . aspirin  300 mg Rectal Daily   Or  . aspirin  325 mg Oral Daily  . insulin aspart  0-15 Units Subcutaneous TID WC  . insulin aspart  0-5 Units Subcutaneous QHS  . pantoprazole  40 mg Oral Daily  . simvastatin  20 mg Oral Daily  . sodium bicarbonate  325 mg Oral QID    ROS: History obtained from the patient  General ROS: negative for - chills, fatigue, fever, night sweats, weight gain or weight loss Psychological ROS: negative for - behavioral disorder, hallucinations, memory difficulties, mood swings or suicidal ideation Ophthalmic ROS: negative for - blurry vision, double vision, eye pain or loss of vision ENT ROS: negative for - epistaxis, nasal discharge, oral lesions, sore throat, tinnitus or vertigo Allergy and Immunology ROS: negative for - hives or itchy/watery eyes Hematological and Lymphatic ROS: negative for - bleeding problems, bruising or swollen lymph nodes Endocrine ROS: negative for - galactorrhea, hair pattern changes, polydipsia/polyuria or temperature intolerance Respiratory ROS: negative for - cough,  hemoptysis, shortness of breath or wheezing Cardiovascular ROS: negative for - chest pain, dyspnea on exertion, edema or irregular heartbeat Gastrointestinal ROS: negative for - abdominal pain, diarrhea, hematemesis, nausea/vomiting or stool incontinence Genito-Urinary ROS: negative for - dysuria, hematuria, incontinence or urinary frequency/urgency Musculoskeletal ROS: negative for - joint swelling or muscular weakness Neurological ROS: as noted in HPI Dermatological ROS: negative for rash and skin lesion changes  Physical Examination: Blood pressure 166/70, pulse 87, temperature  97.5 F (36.4 C), temperature source Oral, resp. rate 18, height 5\' 1"  (1.549 m), weight 81.149 kg (178 lb 14.4 oz), SpO2 95 %.  HEENT-  Normocephalic, no lesions, without obvious abnormality.  Normal external eye and conjunctiva.  Normal TM's bilaterally.  Normal auditory canals and external ears. Normal external nose, mucus membranes and septum.  Normal pharynx. Cardiovascular- S1, S2 normal, pulses palpable throughout   Lungs- chest clear, no wheezing, rales, normal symmetric air entry Abdomen- soft, non-tender; bowel sounds normal; no masses,  no organomegaly Extremities- no edema Lymph-no adenopathy palpable Musculoskeletal-no joint tenderness, deformity or swelling Skin-warm and dry, no hyperpigmentation, vitiligo, or suspicious lesions  Neurological Examination Mental Status: Alert, oriented, thought content appropriate.  Speech fluent without evidence of aphasia.  Able to follow 3 step commands without difficulty. Cranial Nerves: II: Discs flat bilaterally; Visual fields grossly normal, pupils equal, round, reactive to light and accommodation III,IV, VI: ptosis not present, extra-ocular motions intact bilaterally V,VII: smile symmetric, facial light touch sensation normal bilaterally VIII: hearing normal bilaterally IX,X: gag reflex present XI: bilateral shoulder shrug XII: midline tongue extension Motor: Right : Upper extremity   5/5    Left:     Upper extremity   5/5  Lower extremity   5/5     Lower extremity   5/5 Tone and bulk:normal tone throughout; no atrophy noted Sensory: Pinprick and light touch intact throughout, bilaterally Deep Tendon Reflexes: 2+ and symmetric with absent AJ's bilaterally Plantars: Right: upgoing   Left: downgoing Cerebellar: Normal finger-to-nose and normal heel-to-shin testing bilaterally Gait: not tested due to safety concerns   Laboratory Studies:  Basic Metabolic Panel:  Recent Labs Lab 03/27/16 2231  NA 139  K 3.6  CL 108  CO2  24  GLUCOSE 174*  BUN 50*  CREATININE 1.73*  CALCIUM 9.5    Liver Function Tests: No results for input(s): AST, ALT, ALKPHOS, BILITOT, PROT, ALBUMIN in the last 168 hours. No results for input(s): LIPASE, AMYLASE in the last 168 hours. No results for input(s): AMMONIA in the last 168 hours.  CBC:  Recent Labs Lab 03/27/16 2231  WBC 9.7  HGB 11.9*  HCT 35.3  MCV 88.4  PLT 213    Cardiac Enzymes:  Recent Labs Lab 03/27/16 2231  TROPONINI <0.03    BNP: Invalid input(s): POCBNP  CBG:  Recent Labs Lab 03/28/16 0740  GLUCAP 113*    Microbiology: No results found for this or any previous visit.  Coagulation Studies:  Recent Labs  03/27/16 2231  LABPROT 13.5  INR 1.01    Urinalysis:  Recent Labs Lab 03/28/16 0056  COLORURINE STRAW*  LABSPEC 1.010  PHURINE 6.0  GLUCOSEU NEGATIVE  HGBUR NEGATIVE  BILIRUBINUR NEGATIVE  KETONESUR NEGATIVE  PROTEINUR NEGATIVE  NITRITE NEGATIVE  LEUKOCYTESUR TRACE*    Lipid Panel: No results found for: CHOL, TRIG, HDL, CHOLHDL, VLDL, LDLCALC  HgbA1C: No results found for: HGBA1C  Urine Drug Screen:  No results found for: LABOPIA, COCAINSCRNUR, LABBENZ, AMPHETMU, THCU, LABBARB  Alcohol Level: No results for  input(s): ETH in the last 168 hours.  Other results: EKG: atrial fibrillation, rate 81 bpm.  Imaging: Ct Head Wo Contrast  03/27/2016  CLINICAL DATA:  Right-sided weakness. EXAM: CT HEAD WITHOUT CONTRAST TECHNIQUE: Contiguous axial images were obtained from the base of the skull through the vertex without intravenous contrast. COMPARISON:  None. FINDINGS: The brain demonstrates no evidence of hemorrhage, infarction, edema, mass effect, extra-axial fluid collection, hydrocephalus or mass lesion. The skull is unremarkable. IMPRESSION: Normal head CT.  No acute findings. Electronically Signed   By: Aletta Edouard M.D.   On: 03/27/2016 23:55    Assessment: 82 y.o. female presenting with right sided  weakness/numbness that has resolved.  Patient found to be in atrial fibrillation.  Has multiple other vascular risk factors as well.  Started on heparin with plans to transition to anticoagulation-cardiology consulted.    Stroke Risk Factors - atrial fibrillation, diabetes mellitus, hyperlipidemia and hypertension  Plan: 1. HgbA1c, fasting lipid panel pending 2. MRI, MRA  of the brain without contrast pending 3. PT consult, OT consult, Speech consult 4. Echocardiogram pending 5. Carotid dopplers pending 6. Prophylactic therapy-Patient will require anticoagulation 7. NPO until RN stroke swallow screen 8. Telemetry monitoring 9. Frequent neuro checks   Alexis Goodell, MD Neurology [phone] 03/28/2016, 9:51 AM

## 2016-03-28 NOTE — H&P (Signed)
[facility] [place] NAME: Robin Morales    MR#:  [id number]  DATE OF BIRTH:  02/28/1926  DATE OF ADMISSION:  03/27/2016  PRIMARY CARE PHYSICIAN: Madelyn Brunner, MD   REQUESTING/REFERRING PHYSICIAN:   CHIEF COMPLAINT:   Chief Complaint  Patient presents with  . Extremity Weakness    HISTORY OF PRESENT ILLNESS: Robin Morales  is a 82 y.o. female with a known history of Diabetes mellitus, hypertension, arthritis, hyperlipidemia presented to the emergency room because of weakness in the right side of the body started around 9 PM yesterday night. Patient was lying down on the couch at home and she felt weak in the right side of the body. She also had some numbness in the right foot. No history of slurred speech. No complaints of any difficulty swallowing food. Patient never had any stroke in the past. Patient was worked up with a CT head which showed no acute intracranial abnormality. During the workup in the emergency room patient's EKG showed atrial fibrillation patient was put on the heparin drip in the emergency room. No history of any chest pain and first set of troponin is negative. In the emergency room patient was able to move the right upper and lower extremity without any difficulty. Numbness also has improved. No history of fall or head injury. No history of fever or chills or cough.  PAST MEDICAL HISTORY:   Past Medical History  Diagnosis Date  . Diabetes mellitus without complication ([place])   . Hypertension   . Arthritis   . Hypercholesterolemia     PAST SURGICAL HISTORY: Past Surgical History  Procedure Laterality Date  . Hip arthroplasty      SOCIAL HISTORY:  Social History  Substance Use Topics  . Smoking status: Never Smoker   . Smokeless tobacco: Not on file  . Alcohol Use: No    FAMILY HISTORY:  Family History  Problem Relation Age of Onset  . Hypertension Daughter   . Diabetes Sister     DRUG  ALLERGIES: No Known Allergies  REVIEW OF SYSTEMS:   CONSTITUTIONAL: No fever, has weakness.  EYES: No blurred or double vision.  EARS, NOSE, AND THROAT: No tinnitus or ear pain.  RESPIRATORY: No cough, shortness of breath, wheezing or hemoptysis.  CARDIOVASCULAR: No chest pain, orthopnea, edema.  GASTROINTESTINAL: No nausea, vomiting, diarrhea or abdominal pain.  GENITOURINARY: No dysuria, hematuria.  ENDOCRINE: No polyuria, nocturia,  HEMATOLOGY: No anemia, easy bruising or bleeding SKIN: No rash or lesion. MUSCULOSKELETAL: No joint pain or arthritis.   NEUROLOGIC: No tingling, has numbness right foot, weakness right side of body.  PSYCHIATRY: No anxiety or depression.   MEDICATIONS AT HOME:  Prior to Admission medications   Medication Sig Start Date End Date Taking? Authorizing Provider  amLODipine (NORVASC) 2.5 MG tablet Take 1 tablet by mouth daily. 03/09/16  Yes Historical Provider, MD  Magnesium Hydroxide (MAGNESIA PO) Take 25 mg by mouth daily.   Yes Historical Provider, MD  metoprolol (LOPRESSOR) 50 MG tablet Take 1 tablet by mouth 2 (two) times daily. 03/09/16  Yes Historical Provider, MD  omeprazole (PRILOSEC) 40 MG capsule Take 1 capsule by mouth daily. 03/18/16  Yes Historical Provider, MD  simvastatin (ZOCOR) 20 MG tablet Take 1 tablet by mouth daily. 03/09/16  Yes Historical Provider, MD  sodium bicarbonate 325 MG tablet Take 325 mg by mouth 4 (four) times daily.   Yes Historical Provider, MD  metFORMIN (GLUCOPHAGE) 500  MG tablet Take 1 tablet by mouth 2 (two) times daily. Reported on 03/28/2016 03/09/16   Historical Provider, MD      PHYSICAL EXAMINATION:   VITAL SIGNS: Blood pressure 139/68, pulse 71, temperature 98.3 F (36.8 C), temperature source Oral, resp. rate 13, height 5\' 1"  (1.549 m), weight 81.149 kg (178 lb 14.4 oz), SpO2 94 %.  GENERAL:  82 y.o.-year-old patient lying in the bed with no acute distress.  EYES: Pupils equal, round, reactive to light and  accommodation. No scleral icterus. Extraocular muscles intact.  HEENT: Head atraumatic, normocephalic. Oropharynx and nasopharynx clear.  NECK:  Supple, no jugular venous distention. No thyroid enlargement, no tenderness.  LUNGS: Normal breath sounds bilaterally, no wheezing, rales,rhonchi or crepitation. No use of accessory muscles of respiration.  CARDIOVASCULAR: S1, S2 irregular. No murmurs, rubs, or gallops.  ABDOMEN: Soft, nontender, nondistended. Bowel sounds present. No organomegaly or mass.  EXTREMITIES: No pedal edema, cyanosis, or clubbing.  NEUROLOGIC: Cranial nerves II through XII are intact. Muscle strength 5/5 in all extremities. Sensation intact. Gait not checked. No cerebellar signs noted PSYCHIATRIC: The patient is alert and oriented x 3.  SKIN: No obvious rash, lesion, or ulcer.   LABORATORY PANEL:   CBC  Recent Labs Lab 03/27/16 2231  WBC 9.7  HGB 11.9*  HCT 35.3  PLT 213  MCV 88.4  MCH 29.8  MCHC 33.8  RDW 13.8   ------------------------------------------------------------------------------------------------------------------  Chemistries   Recent Labs Lab 03/27/16 2231  NA 139  K 3.6  CL 108  CO2 24  GLUCOSE 174*  BUN 50*  CREATININE 1.73*  CALCIUM 9.5   ------------------------------------------------------------------------------------------------------------------ estimated creatinine clearance is 22.1 mL/min (by C-G formula based on Cr of 1.73). ------------------------------------------------------------------------------------------------------------------ No results for input(s): TSH, T4TOTAL, T3FREE, THYROIDAB in the last 72 hours.  Invalid input(s): FREET3   Coagulation profile  Recent Labs Lab 03/27/16 2231  INR 1.01   ------------------------------------------------------------------------------------------------------------------- No results for input(s): DDIMER in the last 72  hours. -------------------------------------------------------------------------------------------------------------------  Cardiac Enzymes  Recent Labs Lab 03/27/16 2231  TROPONINI <0.03   ------------------------------------------------------------------------------------------------------------------ Invalid input(s): POCBNP  ---------------------------------------------------------------------------------------------------------------  Urinalysis    Component Value Date/Time   COLORURINE STRAW* 03/28/2016 0056   APPEARANCEUR CLEAR* 03/28/2016 0056   LABSPEC 1.010 03/28/2016 0056   PHURINE 6.0 03/28/2016 0056   GLUCOSEU NEGATIVE 03/28/2016 0056   HGBUR NEGATIVE 03/28/2016 0056   BILIRUBINUR NEGATIVE 03/28/2016 0056   KETONESUR NEGATIVE 03/28/2016 0056   PROTEINUR NEGATIVE 03/28/2016 0056   NITRITE NEGATIVE 03/28/2016 0056   LEUKOCYTESUR TRACE* 03/28/2016 0056     RADIOLOGY: Ct Head Wo Contrast  03/27/2016  CLINICAL DATA:  Right-sided weakness. EXAM: CT HEAD WITHOUT CONTRAST TECHNIQUE: Contiguous axial images were obtained from the base of the skull through the vertex without intravenous contrast. COMPARISON:  None. FINDINGS: The brain demonstrates no evidence of hemorrhage, infarction, edema, mass effect, extra-axial fluid collection, hydrocephalus or mass lesion. The skull is unremarkable. IMPRESSION: Normal head CT.  No acute findings. Electronically Signed   By: Aletta Edouard M.D.   On: 03/27/2016 23:55    EKG: Orders placed or performed during the hospital encounter of 03/27/16  . ED EKG  . ED EKG  . EKG 12-Lead  . EKG 12-Lead  . EKG 12-Lead  . EKG 12-Lead    IMPRESSION AND PLAN: 82 year old female patient with history of hypertension, hyperlipidemia, arthritis, diabetes mellitus presented to the emergency room with weakness in the right side of the body.  Admitting diagnosis : 1. Transient ischemic attack 2.  New onset atrial fibrillation 3.  Dehydration 4. Hypertension 5. Hyperlipidemia 6. Type 2 diabetes mellitus  Treatment plan : Admit patient to telemetry Start patient on oral aspirin MRI brain to assess for CVA Check carotid ultrasound to rule out obstruction Echocardiogram Neurology consultation and cardiology consultation Anticoagulation with IV heparin drip Supportive care.   All the records are reviewed and case discussed with ED provider. Management plans discussed with the patient, family and they are in agreement.  CODE STATUS:FULL Code Status History    This patient does not have a recorded code status. Please follow your organizational policy for patients in this situation.       TOTAL TIME TAKING CARE OF THIS PATIENT: 50 minutes.    Saundra Shelling M.D on 03/28/2016 at 2:25 AM  Between 7am to 6pm - Pager - [phone]  After 6pm go to [email or web address] - password [email or web address] [facility]  Office  [phone]  CC: Primary care physician; Madelyn Brunner, MD

## 2016-03-28 NOTE — Consult Note (Signed)
ANTICOAGULATION CONSULT NOTE - Initial Consult  Pharmacy Consult for apixaban Indication: atrial fibrillation  No Known Allergies  Patient Measurements: Height: 5\' 1"  (154.9 cm) Weight: 178 lb 14.4 oz (81.149 kg) IBW/kg (Calculated) : 47.8 Heparin Dosing Weight:   Vital Signs: Temp: 97.6 F (36.4 C) (04/04 1402) Temp Source: Oral (04/04 1402) BP: 166/59 mmHg (04/04 1752) Pulse Rate: 84 (04/04 1752)  Labs:  Recent Labs  03/27/16 2231 03/28/16 1401  HGB 11.9*  --   HCT 35.3  --   PLT 213  --   APTT 35  --   LABPROT 13.5  --   INR 1.01  --   HEPARINUNFRC  --  <0.10*  CREATININE 1.73*  --   TROPONINI <0.03  --     Estimated Creatinine Clearance: 22.1 mL/min (by C-G formula based on Cr of 1.73).   Medical History: Past Medical History  Diagnosis Date  . Diabetes mellitus without complication ([place])   . Hypertension   . Arthritis   . Hypercholesterolemia     Medications:  Scheduled:  . apixaban  2.5 mg Oral BID  . [START ON 03/29/2016] atorvastatin  40 mg Oral q1800  . insulin aspart  0-15 Units Subcutaneous TID WC  . insulin aspart  0-5 Units Subcutaneous QHS  . metoprolol tartrate  25 mg Oral BID  . pantoprazole  40 mg Oral Daily  . sodium bicarbonate  325 mg Oral QID    Assessment: Pt is a 82 year old female with afib and s/p TIA Goal of Therapy:   Monitor platelets by anticoagulation protocol: Yes   Plan:  apixaban 2.5mg  BID. Instructed nurse to discontinue heparin drip at time of first apixaban dose.  Ramond Dial, Pharm.D Clinical Pharmacist   03/28/2016,7:33 PM

## 2016-03-29 LAB — CREATININE, SERUM
CREATININE: 1.31 mg/dL — AB (ref 0.44–1.00)
GFR, EST AFRICAN AMERICAN: 41 mL/min — AB (ref >60)
GFR, EST NON AFRICAN AMERICAN: 35 mL/min — AB (ref >60)

## 2016-03-29 LAB — CBC
HCT: 31.4 % — ABNORMAL LOW (ref 35.0–47.0)
Hemoglobin: 10.8 g/dL — ABNORMAL LOW (ref 12.0–16.0)
MCH: 30.5 pg (ref 26.0–34.0)
MCHC: 34.4 g/dL (ref 32.0–36.0)
MCV: 88.9 fL (ref 80.0–100.0)
PLATELETS: 199 10*3/uL (ref 150–440)
RBC: 3.53 MIL/uL — AB (ref 3.80–5.20)
RDW: 13.5 % (ref 11.5–14.5)
WBC: 8 10*3/uL (ref 3.6–11.0)

## 2016-03-29 LAB — ECHOCARDIOGRAM COMPLETE
Height: 61 in
Weight: 2862.4 oz

## 2016-03-29 LAB — GLUCOSE, CAPILLARY: GLUCOSE-CAPILLARY: 112 mg/dL — AB (ref 65–99)

## 2016-03-29 MED ORDER — ATORVASTATIN CALCIUM 40 MG PO TABS: 40.0000 mg | ORAL_TABLET | Freq: Every day | ORAL | Status: DC

## 2016-03-29 MED ORDER — APIXABAN 2.5 MG PO TABS: 2.5000 mg | ORAL_TABLET | Freq: Two times a day (BID) | ORAL | Status: DC

## 2016-03-29 MED ORDER — APIXABAN 5 MG PO TABS
5.0000 mg | ORAL_TABLET | Freq: Two times a day (BID) | ORAL | Status: DC
  Administered 2016-03-29: 09:00:00 5 mg via ORAL
  Filled 2016-03-29: qty 1

## 2016-03-29 NOTE — Care Management (Signed)
Admitted to [facility] regional with the diagnosis of A-Fib. Lives with daughter, [name] [phone]). Last seen Dr. Lisette Grinder in January. Next appointment this month. Uses a rolling walker to aid in ambulation. Home Health in the past following Hip surgery through [facility]. No skilled facility. No Home oxygen. Takes care of all basic activities of daily living herself. Doesn't drive. Last fall was in January. Decreased appetite. Family will transport. Physical therapy evaluation completed. Recommends home with home health/phyical therapy. [place]. Will update Floydene Flock, [facility] representative. Discharge to home today per Dr. Manuella Ghazi. Eliquis card given to daughter. Shelbie Ammons RN MSN CCM Care Management [phone]

## 2016-03-29 NOTE — Discharge Instructions (Signed)
Stroke Prevention °Some health problems and behaviors may make it more likely for you to have a stroke. Below are ways to lessen your risk of having a stroke.  °· Be active for at least 30 minutes on most or all days. °· Do not smoke. Try not to be around others who smoke. °· Do not drink too much alcohol. °¨ Do not have more than 2 drinks a day if you are a man. °¨ Do not have more than 1 drink a day if you are a woman and are not pregnant. °· Eat healthy foods, such as fruits and vegetables. If you were put on a specific diet, follow the diet as told. °· Keep your cholesterol levels under control through diet and medicines. Look for foods that are low in saturated fat, trans fat, cholesterol, and are high in fiber. °· If you have diabetes, follow all diet plans and take your medicine as told. °· Ask your doctor if you need treatment to lower your blood pressure. If you have high blood pressure (hypertension), follow all diet plans and take your medicine as told by your doctor. °· If you are 18-39 years old, have your blood pressure checked every 3-5 years. If you are age 40 or older, have your blood pressure checked every year. °· Keep a healthy weight. Eat foods that are low in calories, salt, saturated fat, trans fat, and cholesterol. °· Do not take drugs. °· Avoid birth control pills, if this applies. Talk to your doctor about the risks of taking birth control pills. °· Talk to your doctor if you have sleep problems (sleep apnea). °· Take all medicine as told by your doctor. °¨ You may be told to take aspirin or blood thinner medicine. Take this medicine as told by your doctor. °¨ Understand your medicine instructions. °· Make sure any other conditions you have are being taken care of. °GET HELP RIGHT AWAY IF: °· You suddenly lose feeling (you feel numb) or have weakness in your face, arm, or leg. °· Your face or eyelid hangs down to one side. °· You suddenly feel confused. °· You have trouble talking (aphasia)  or understanding what people are saying. °· You suddenly have trouble seeing in one or both eyes. °· You suddenly have trouble walking. °· You are dizzy. °· You lose your balance or your movements are clumsy (uncoordinated). °· You suddenly have a very bad headache and you do not know the cause. °· You have new chest pain. °· Your heart feels like it is fluttering or skipping a beat (irregular heartbeat). °Do not wait to see if the symptoms above go away. Get help right away. Call your local emergency services (911 in [place] Do not drive yourself to the hospital. °  °This information is not intended to replace advice given to you by your health care provider. Make sure you discuss any questions you have with your health care provider. °  °Document Released: 06/11/2012 Document Revised: 01/01/2015 Document Reviewed: 06/13/2013 °Elsevier Interactive Patient Education ©2016 [employer] ° °

## 2016-03-29 NOTE — Consult Note (Signed)
ANTICOAGULATION CONSULT NOTE - Initial Consult  Pharmacy Consult for apixaban Indication: atrial fibrillation  No Known Allergies  Patient Measurements: Height: 5\' 1"  (154.9 cm) Weight: 178 lb 14.4 oz (81.149 kg) IBW/kg (Calculated) : 47.8  Vital Signs: Temp: 98 F (36.7 C) (04/04 2203) Temp Source: Oral (04/04 2203) BP: 158/61 mmHg (04/04 2203) Pulse Rate: 74 (04/04 2203)  Labs:  Recent Labs  03/27/16 2231 03/28/16 1401 03/29/16 0510  HGB 11.9*  --  10.8*  HCT 35.3  --  31.4*  PLT 213  --  199  APTT 35  --   --   LABPROT 13.5  --   --   INR 1.01  --   --   HEPARINUNFRC  --  <0.10*  --   CREATININE 1.73*  --  1.31*  TROPONINI <0.03  --   --     Estimated Creatinine Clearance: 29.2 mL/min (by C-G formula based on Cr of 1.31).   Medical History: Past Medical History  Diagnosis Date  . Diabetes mellitus without complication ([place])   . Hypertension   . Arthritis   . Hypercholesterolemia     Medications:  Scheduled:  . apixaban  2.5 mg Oral BID  . atorvastatin  40 mg Oral q1800  . insulin aspart  0-15 Units Subcutaneous TID WC  . insulin aspart  0-5 Units Subcutaneous QHS  . metoprolol tartrate  25 mg Oral BID  . pantoprazole  40 mg Oral Daily  . sodium bicarbonate  325 mg Oral QID    Assessment: Pt is a 82 year old female with afib and s/p TIA    Plan:  Based on Scr improvement today (now 1.31, down from 1.73), will adjust apixaban dose to 5mg  BID.  Nancy Fetter, PharmD Pharmacy Resident 03/29/2016,8:33 AM

## 2016-03-29 NOTE — Progress Notes (Signed)
Discharge instructions given and went over with patient and 2 daughters at bedside. All questions answered. Patient discharged home with daughters via wheelchair by nursing staff. Madlyn Frankel, RN

## 2016-03-29 NOTE — Progress Notes (Signed)
OT Cancellation Note  Patient Details Name: Robin Morales MRN: [id number] DOB: 1926/09/30   Cancelled Treatment:    Reason Eval/Treat Not Completed: Other (comment) (OT Eval attempted. Pt. was in bathroom upon arrival. Pt. daughters report that pt.. is leaving the hospital  directly after she is finished. OT education was provided to the daughters.)   Harrel Carina, MS, OTR/L  Harrel Carina 03/29/2016, 9:57 AM

## 2016-04-02 NOTE — Discharge Summary (Signed)
[facility] NAME: Robin Morales    MR#:  [id number]  DATE OF BIRTH:  1926/05/31  DATE OF ADMISSION:  03/27/2016 ADMITTING PHYSICIAN: Saundra Shelling, MD  DATE OF DISCHARGE: 03/29/2016 12:36 PM  PRIMARY CARE PHYSICIAN: Madelyn Brunner, MD    ADMISSION DIAGNOSIS:  TIA (transient ischemic attack) [G45.9] Transient cerebral ischemia, unspecified transient cerebral ischemia type [G45.9]  DISCHARGE DIAGNOSIS:  Principal Problem:   A-fib ([place]) Active Problems:   TIA (transient ischemic attack)   SECONDARY DIAGNOSIS:   Past Medical History  Diagnosis Date  . Diabetes mellitus without complication ([place])   . Hypertension   . Arthritis   . Hypercholesterolemia     HOSPITAL COURSE:  82 year old female patient with history of hypertension, hyperlipidemia, arthritis, diabetes mellitus admitted with weakness in the right side of the body.  * Acute CVA: small left PCA territory infarct. Carotid showed no significant stenosis. Started full dose anticoagulation with eliquis * New onset atrial fibrillation: d/w cardio - she'll need full dose anticoagulation considering high CHADs score. D/Ced on eliquis * Dehydration: improved with iv hydratioon * Hypertension: started metoprolol * Hyperlipidemia: high intensity statin * Type 2 diabetes mellitus:   Her weakness was resolved and was DC home in stable condition.  DISCHARGE CONDITIONS:   stable  CONSULTS OBTAINED:  Treatment Team:  Alexis Goodell, MD Teodoro Spray, MD  DRUG ALLERGIES:  No Known Allergies  DISCHARGE MEDICATIONS:   Discharge Medication List as of 03/29/2016  8:49 AM    START taking these medications   Details  atorvastatin (LIPITOR) 40 MG tablet Take 1 tablet (40 mg total) by mouth daily at 6 PM., Starting 03/29/2016, Until Discontinued, Normal    apixaban (ELIQUIS) 2.5 MG TABS tablet Take 1 tablet (2.5 mg total) by mouth 2 (two) times daily., Starting  03/29/2016, Until Discontinued, Normal      CONTINUE these medications which have NOT CHANGED   Details  amLODipine (NORVASC) 2.5 MG tablet Take 1 tablet by mouth daily., Starting 03/09/2016, Until Discontinued, Historical Med    Magnesium Hydroxide (MAGNESIA PO) Take 25 mg by mouth daily., Until Discontinued, Historical Med    metoprolol (LOPRESSOR) 50 MG tablet Take 1 tablet by mouth 2 (two) times daily., Starting 03/09/2016, Until Discontinued, Historical Med    omeprazole (PRILOSEC) 40 MG capsule Take 1 capsule by mouth daily., Starting 03/18/2016, Until Discontinued, Historical Med    sodium bicarbonate 325 MG tablet Take 325 mg by mouth 4 (four) times daily., Until Discontinued, Historical Med    metFORMIN (GLUCOPHAGE) 500 MG tablet Take 1 tablet by mouth 2 (two) times daily. Reported on 03/28/2016, Starting 03/09/2016, Until Discontinued, Historical Med      STOP taking these medications     simvastatin (ZOCOR) 20 MG tablet          DISCHARGE INSTRUCTIONS:    DIET:  Cardiac diet  DISCHARGE CONDITION:  Good  ACTIVITY:  Activity as tolerated  OXYGEN:  Home Oxygen: No.   Oxygen Delivery: room air  DISCHARGE LOCATION:  home   If you experience worsening of your admission symptoms, develop shortness of breath, life threatening emergency, suicidal or homicidal thoughts you must seek medical attention immediately by calling 911 or calling your MD immediately  if symptoms less severe.  You Must read complete instructions/literature along with all the possible adverse reactions/side effects for all the Medicines you take and that have been prescribed to you. Take any new Medicines  after you have completely understood and accpet all the possible adverse reactions/side effects.   Please note  You were cared for by a hospitalist during your hospital stay. If you have any questions about your discharge medications or the care you received while you were in the hospital after you  are discharged, you can call the unit and asked to speak with the hospitalist on call if the hospitalist that took care of you is not available. Once you are discharged, your primary care physician will handle any further medical issues. Please note that NO REFILLS for any discharge medications will be authorized once you are discharged, as it is imperative that you return to your primary care physician (or establish a relationship with a primary care physician if you do not have one) for your aftercare needs so that they can reassess your need for medications and monitor your lab values.    On the day of Discharge:  VITAL SIGNS:  Blood pressure 158/85, pulse 83, temperature 98 F (36.7 C), temperature source Oral, resp. rate 18, height 5\' 1"  (1.549 m), weight 81.149 kg (178 lb 14.4 oz), SpO2 95 %.  PHYSICAL EXAMINATION:  GENERAL:  82 y.o.-year-old patient lying in the bed with no acute distress.  EYES: Pupils equal, round, reactive to light and accommodation. No scleral icterus. Extraocular muscles intact.  HEENT: Head atraumatic, normocephalic. Oropharynx and nasopharynx clear.  NECK:  Supple, no jugular venous distention. No thyroid enlargement, no tenderness.  LUNGS: Normal breath sounds bilaterally, no wheezing, rales,rhonchi or crepitation. No use of accessory muscles of respiration.  CARDIOVASCULAR: S1, S2 normal. No murmurs, rubs, or gallops.  ABDOMEN: Soft, non-tender, non-distended. Bowel sounds present. No organomegaly or mass.  EXTREMITIES: No pedal edema, cyanosis, or clubbing.  NEUROLOGIC: Cranial nerves II through XII are intact. Muscle strength 5/5 in all extremities. Sensation intact. Gait not checked.  PSYCHIATRIC: The patient is alert and oriented x 3.  SKIN: No obvious rash, lesion, or ulcer.  DATA REVIEW:   CBC  Recent Labs Lab 03/29/16 0510  WBC 8.0  HGB 10.8*  HCT 31.4*  PLT 199    Chemistries   Recent Labs Lab 03/27/16 2231 03/29/16 0510  NA 139  --    K 3.6  --   CL 108  --   CO2 24  --   GLUCOSE 174*  --   BUN 50*  --   CREATININE 1.73* 1.31*  CALCIUM 9.5  --       Management plans discussed with the patient, family and they are in agreement.  CODE STATUS:  Code Status History    Date Active Date Inactive Code Status Order ID Comments User Context   03/28/2016  7:46 AM 03/29/2016  3:36 PM Full Code [id number]  Saundra Shelling, MD Inpatient      TOTAL TIME TAKING CARE OF THIS PATIENT: 45 minutes.    [facility], Merritt Mccravy M.D on 04/02/2016 at 8:14 PM  Between 7am to 6pm - Pager - [phone]  After 6pm go to [email or web address] - password [email or web address] [facility]  Office  [phone]  CC: Primary care physician; Madelyn Brunner, MD   Note: This dictation was prepared with Dragon dictation along with smaller phrase technology. Any transcriptional errors that result from this process are unintentional.

## 2016-04-11 DIAGNOSIS — E1121 Type 2 diabetes mellitus with diabetic nephropathy: Secondary | ICD-10-CM | POA: Diagnosis not present

## 2016-04-14 DIAGNOSIS — I6930 Unspecified sequelae of cerebral infarction: Secondary | ICD-10-CM

## 2016-04-14 DIAGNOSIS — E1121 Type 2 diabetes mellitus with diabetic nephropathy: Secondary | ICD-10-CM | POA: Diagnosis not present

## 2016-04-14 DIAGNOSIS — I481 Persistent atrial fibrillation: Secondary | ICD-10-CM | POA: Diagnosis not present

## 2016-04-14 DIAGNOSIS — I10 Essential (primary) hypertension: Secondary | ICD-10-CM | POA: Diagnosis not present

## 2016-04-14 DIAGNOSIS — I639 Cerebral infarction, unspecified: Secondary | ICD-10-CM | POA: Diagnosis not present

## 2016-04-14 HISTORY — DX: Unspecified sequelae of cerebral infarction: I69.30

## 2016-07-07 DIAGNOSIS — E1121 Type 2 diabetes mellitus with diabetic nephropathy: Secondary | ICD-10-CM | POA: Diagnosis not present

## 2016-07-14 DIAGNOSIS — E1121 Type 2 diabetes mellitus with diabetic nephropathy: Secondary | ICD-10-CM | POA: Diagnosis not present

## 2016-07-14 DIAGNOSIS — I481 Persistent atrial fibrillation: Secondary | ICD-10-CM | POA: Diagnosis not present

## 2016-07-14 DIAGNOSIS — I2510 Atherosclerotic heart disease of native coronary artery without angina pectoris: Secondary | ICD-10-CM | POA: Diagnosis not present

## 2016-07-14 DIAGNOSIS — I10 Essential (primary) hypertension: Secondary | ICD-10-CM | POA: Diagnosis not present

## 2016-10-09 DIAGNOSIS — E1121 Type 2 diabetes mellitus with diabetic nephropathy: Secondary | ICD-10-CM | POA: Diagnosis not present

## 2016-10-16 DIAGNOSIS — I10 Essential (primary) hypertension: Secondary | ICD-10-CM | POA: Diagnosis not present

## 2016-10-16 DIAGNOSIS — E1121 Type 2 diabetes mellitus with diabetic nephropathy: Secondary | ICD-10-CM | POA: Diagnosis not present

## 2016-10-16 DIAGNOSIS — Z23 Encounter for immunization: Secondary | ICD-10-CM | POA: Diagnosis not present

## 2017-01-15 DIAGNOSIS — E1121 Type 2 diabetes mellitus with diabetic nephropathy: Secondary | ICD-10-CM | POA: Diagnosis not present

## 2017-01-22 DIAGNOSIS — E1140 Type 2 diabetes mellitus with diabetic neuropathy, unspecified: Secondary | ICD-10-CM | POA: Diagnosis not present

## 2017-01-22 DIAGNOSIS — I10 Essential (primary) hypertension: Secondary | ICD-10-CM | POA: Diagnosis not present

## 2017-01-22 DIAGNOSIS — E1121 Type 2 diabetes mellitus with diabetic nephropathy: Secondary | ICD-10-CM | POA: Diagnosis not present

## 2017-01-22 DIAGNOSIS — I481 Persistent atrial fibrillation: Secondary | ICD-10-CM | POA: Diagnosis not present

## 2017-01-22 DIAGNOSIS — I2510 Atherosclerotic heart disease of native coronary artery without angina pectoris: Secondary | ICD-10-CM | POA: Diagnosis not present

## 2017-01-23 DIAGNOSIS — E1142 Type 2 diabetes mellitus with diabetic polyneuropathy: Secondary | ICD-10-CM | POA: Diagnosis not present

## 2017-01-23 DIAGNOSIS — B351 Tinea unguium: Secondary | ICD-10-CM | POA: Diagnosis not present

## 2017-01-28 ENCOUNTER — Emergency Department: Payer: Medicare HMO

## 2017-01-28 ENCOUNTER — Encounter: Payer: Self-pay | Admitting: Emergency Medicine

## 2017-01-28 ENCOUNTER — Emergency Department
Admission: EM | Admit: 2017-01-28 | Discharge: 2017-01-28 | Disposition: A | Discharge status: Home / Self Care | Payer: Medicare HMO | Attending: Emergency Medicine | Admitting: Emergency Medicine

## 2017-01-28 DIAGNOSIS — Z79899 Other long term (current) drug therapy: Secondary | ICD-10-CM | POA: Insufficient documentation

## 2017-01-28 DIAGNOSIS — T443X5A Adverse effect of other parasympatholytics [anticholinergics and antimuscarinics] and spasmolytics, initial encounter: Secondary | ICD-10-CM | POA: Insufficient documentation

## 2017-01-28 DIAGNOSIS — Y829 Unspecified medical devices associated with adverse incidents: Secondary | ICD-10-CM | POA: Diagnosis not present

## 2017-01-28 DIAGNOSIS — T50905A Adverse effect of unspecified drugs, medicaments and biological substances, initial encounter: Secondary | ICD-10-CM

## 2017-01-28 DIAGNOSIS — R4781 Slurred speech: Secondary | ICD-10-CM | POA: Diagnosis not present

## 2017-01-28 DIAGNOSIS — R0989 Other specified symptoms and signs involving the circulatory and respiratory systems: Secondary | ICD-10-CM | POA: Diagnosis not present

## 2017-01-28 DIAGNOSIS — Z5181 Encounter for therapeutic drug level monitoring: Secondary | ICD-10-CM | POA: Insufficient documentation

## 2017-01-28 DIAGNOSIS — R410 Disorientation, unspecified: Secondary | ICD-10-CM | POA: Insufficient documentation

## 2017-01-28 DIAGNOSIS — R4701 Aphasia: Secondary | ICD-10-CM | POA: Diagnosis present

## 2017-01-28 DIAGNOSIS — N3001 Acute cystitis with hematuria: Secondary | ICD-10-CM | POA: Diagnosis not present

## 2017-01-28 DIAGNOSIS — E119 Type 2 diabetes mellitus without complications: Secondary | ICD-10-CM | POA: Insufficient documentation

## 2017-01-28 DIAGNOSIS — I10 Essential (primary) hypertension: Secondary | ICD-10-CM | POA: Insufficient documentation

## 2017-01-28 DIAGNOSIS — T887XXA Unspecified adverse effect of drug or medicament, initial encounter: Secondary | ICD-10-CM | POA: Insufficient documentation

## 2017-01-28 LAB — CBC WITH DIFFERENTIAL/PLATELET
BASOS PCT: 0 %
Basophils Absolute: 0 10*3/uL (ref 0–0.1)
Eosinophils Absolute: 0.1 10*3/uL (ref 0–0.7)
Eosinophils Relative: 1 %
HEMATOCRIT: 32.8 % — AB (ref 35.0–47.0)
Hemoglobin: 11.3 g/dL — ABNORMAL LOW (ref 12.0–16.0)
Lymphocytes Relative: 10 %
Lymphs Abs: 0.8 10*3/uL — ABNORMAL LOW (ref 1.0–3.6)
MCH: 30.3 pg (ref 26.0–34.0)
MCHC: 34.4 g/dL (ref 32.0–36.0)
MCV: 88 fL (ref 80.0–100.0)
MONO ABS: 0.7 10*3/uL (ref 0.2–0.9)
MONOS PCT: 9 %
NEUTROS ABS: 6.1 10*3/uL (ref 1.4–6.5)
Neutrophils Relative %: 80 %
Platelets: 226 10*3/uL (ref 150–440)
RBC: 3.72 MIL/uL — ABNORMAL LOW (ref 3.80–5.20)
RDW: 14.5 % (ref 11.5–14.5)
WBC: 7.7 10*3/uL (ref 3.6–11.0)

## 2017-01-28 LAB — COMPREHENSIVE METABOLIC PANEL
ALK PHOS: 121 U/L (ref 38–126)
ALT: 15 U/L (ref 14–54)
AST: 29 U/L (ref 15–41)
Albumin: 4.1 g/dL (ref 3.5–5.0)
Anion gap: 9 (ref 5–15)
BUN: 31 mg/dL — AB (ref 6–20)
CALCIUM: 8.9 mg/dL (ref 8.9–10.3)
CO2: 23 mmol/L (ref 22–32)
Chloride: 102 mmol/L (ref 101–111)
Creatinine, Ser: 1.69 mg/dL — ABNORMAL HIGH (ref 0.44–1.00)
GFR calc Af Amer: 30 mL/min — ABNORMAL LOW (ref >60)
GFR calc non Af Amer: 26 mL/min — ABNORMAL LOW (ref >60)
GLUCOSE: 220 mg/dL — AB (ref 65–99)
Potassium: 4.2 mmol/L (ref 3.5–5.1)
SODIUM: 134 mmol/L — AB (ref 135–145)
Total Bilirubin: 0.9 mg/dL (ref 0.3–1.2)
Total Protein: 7.1 g/dL (ref 6.5–8.1)

## 2017-01-28 LAB — URINALYSIS, COMPLETE (UACMP) WITH MICROSCOPIC
Bilirubin Urine: NEGATIVE
Glucose, UA: NEGATIVE mg/dL
Ketones, ur: NEGATIVE mg/dL
Nitrite: NEGATIVE
Protein, ur: 30 mg/dL — AB
SPECIFIC GRAVITY, URINE: 1.013 (ref 1.005–1.030)
pH: 6 (ref 5.0–8.0)

## 2017-01-28 LAB — AMMONIA: AMMONIA: 16 umol/L (ref 9–35)

## 2017-01-28 LAB — PROTIME-INR
INR: 1.24
Prothrombin Time: 15.7 seconds — ABNORMAL HIGH (ref 11.4–15.2)

## 2017-01-28 LAB — ETHANOL: Alcohol, Ethyl (B): <5 mg/dL (ref <5)

## 2017-01-28 LAB — TROPONIN I: Troponin I: <0.03 ng/mL (ref <0.03)

## 2017-01-28 MED ORDER — CEPHALEXIN 500 MG PO CAPS: 500.0000 mg | ORAL_CAPSULE | Freq: Three times a day (TID) | ORAL | 0 refills | Status: DC

## 2017-01-28 MED ORDER — CEFTRIAXONE SODIUM-DEXTROSE 1-3.74 GM-% IV SOLR
1.0000 g | Freq: Once | INTRAVENOUS | Status: AC
  Administered 2017-01-28: 1 g via INTRAVENOUS
  Filled 2017-01-28: qty 50

## 2017-01-28 MED ORDER — CEFTRIAXONE SODIUM 1 G IJ SOLR: 1.0000 g | Freq: Once | INTRAMUSCULAR | Status: DC

## 2017-01-28 MED ORDER — SODIUM CHLORIDE 0.9 % IV BOLUS (SEPSIS)
500.0000 mL | Freq: Once | INTRAVENOUS | Status: AC
  Administered 2017-01-28: 500 mL via INTRAVENOUS

## 2017-01-28 NOTE — ED Provider Notes (Signed)
[facility] Emergency Department Provider Note  ____________________________________________   I have reviewed the triage vital signs and the nursing notes.   HISTORY  Chief Complaint Aphasia    HPI Robin Morales is a 83 y.o. female Robin Morales presents today not with aphasia as the triage note says but some degree of confusion. She was mumbling, and seemed to see something that wasn't there earlier today. Family states that she was recently started on oxybutynin on Wednesday and all of her symptoms began yesterday. She is therefore ad 2-3 doses that medication prior tosome degree of confusion. This time, as the day wears on she is actually back to her baseline and has no complaint. This been no focal numbness or weakness no nausea no vomiting chest pain or shortness of breath, she has had no dysuria no abdominal pain, she just had a couple episodes where she seemed to "mumble"      Past Medical History:  Diagnosis Date  . Arthritis   . Diabetes mellitus without complication ([place])   . Hypercholesterolemia   . Hypertension     Patient Active Problem List   Diagnosis Date Noted  . A-fib ([place]) 03/28/2016  . TIA (transient ischemic attack) 03/28/2016    Past Surgical History:  Procedure Laterality Date  . HIP ARTHROPLASTY      Prior to Admission medications   Medication Sig Start Date End Date Taking? Authorizing Provider  amLODipine (NORVASC) 2.5 MG tablet Take 1 tablet by mouth daily. 03/09/16  Yes Historical Provider, MD  apixaban (ELIQUIS) 2.5 MG TABS tablet Take 1 tablet (2.5 mg total) by mouth 2 (two) times daily. 03/29/16  Yes Vipul Manuella Ghazi, MD  atorvastatin (LIPITOR) 40 MG tablet Take 1 tablet (40 mg total) by mouth daily at 6 PM. 03/29/16  Yes Vipul Manuella Ghazi, MD  Chlorpheniramine-Acetaminophen (CORICIDIN HBP COLD/FLU PO) Take 1 tablet by mouth daily as needed.   Yes Historical Provider, MD  Diphenhydramine-PE-APAP (DELSYM COUGH/COLD NIGHT TIME PO) Take 1 Dose  by mouth daily as needed.   Yes Historical Provider, MD  Magnesium Hydroxide (MAGNESIA PO) Take 25 mg by mouth daily.   Yes Historical Provider, MD  metoprolol (LOPRESSOR) 50 MG tablet Take 1 tablet by mouth 2 (two) times daily. 03/09/16  Yes Historical Provider, MD  omeprazole (PRILOSEC) 40 MG capsule Take 1 capsule by mouth daily. 03/18/16  Yes Historical Provider, MD  oxybutynin (DITROPAN) 5 MG tablet Take 5 mg by mouth 3 (three) times daily.   Yes Historical Provider, MD  sodium bicarbonate 325 MG tablet Take 325 mg by mouth 4 (four) times daily.   Yes Historical Provider, MD    Allergies Patient has no known allergies.  Family History  Problem Relation Age of Onset  . Hypertension Daughter   . Diabetes Sister     Social History Social History  Substance Use Topics  . Smoking status: Never Smoker  . Smokeless tobacco: Not on file  . Alcohol use No    Review of Systems Constitutional: No fever/chills Eyes: No visual changes. ENT: No sore throat. No stiff neck no neck pain Cardiovascular: Denies chest pain. Respiratory: Denies shortness of breath. Gastrointestinal:   no vomiting.  No diarrhea.  No constipation. Genitourinary: Negative for dysuria. Musculoskeletal: Negative lower extremity swelling Skin: Negative for rash. Neurological: Negative for severe headaches, focal weakness or numbness. 10-point ROS otherwise negative.  ____________________________________________   PHYSICAL EXAM:  VITAL SIGNS: ED Triage Vitals  Enc Vitals Group     BP 01/28/17  1453 (!) 212/66     Pulse Rate 01/28/17 1453 65     Resp 01/28/17 1845 16     Temp --      Temp src --      SpO2 01/28/17 1453 96 %     Weight --      Height --      Head Circumference --      Peak Flow --      Pain Score --      Pain Loc --      Pain Edu? --      Excl. in [place]? --     Constitutional: Alert and oriented to name, year, and unsure of the rest of the day. At baseline according to family. . Well  appearing and in no acute distress. Eyes: Conjunctivae are normal. PERRL. EOMI. Head: Atraumatic. Nose: No congestion/rhinnorhea. Mouth/Throat: Mucous membranes are moist.  Oropharynx non-erythematous. Neck: No stridor.   Nontender with no meningismus Cardiovascular: Normal rate, regular rhythm. Grossly normal heart sounds.  Good peripheral circulation. Respiratory: Normal respiratory effort.  No retractions. Lungs CTAB. Abdominal: Soft and nontender. No distention. No guarding no rebound Back:  There is no focal tenderness or step off.  there is no midline tenderness there are no lesions noted. there is no CVA tenderness Musculoskeletal: No lower extremity tenderness, no upper extremity tenderness. No joint effusions, no DVT signs strong distal pulses no edema Neurologic:  Normal speech and language. No gross focal neurologic deficits are appreciated.  Skin:  Skin is warm, dry and intact. No rash noted. Psychiatric: Mood and affect are normal. Speech and behavior are normal.  ____________________________________________   LABS (all labs ordered are listed, but only abnormal results are displayed)  Labs Reviewed  COMPREHENSIVE METABOLIC PANEL - Abnormal; Notable for the following:       Result Value   Sodium 134 (*)    Glucose, Bld 220 (*)    BUN 31 (*)    Creatinine, Ser 1.69 (*)    GFR calc non Af Amer 26 (*)    GFR calc Af Amer 30 (*)    All other components within normal limits  CBC WITH DIFFERENTIAL/PLATELET - Abnormal; Notable for the following:    RBC 3.72 (*)    Hemoglobin 11.3 (*)    HCT 32.8 (*)    Lymphs Abs 0.8 (*)    All other components within normal limits  URINALYSIS, COMPLETE (UACMP) WITH MICROSCOPIC - Abnormal; Notable for the following:    Color, Urine YELLOW (*)    APPearance CLEAR (*)    Hgb urine dipstick LARGE (*)    Protein, ur 30 (*)    Leukocytes, UA SMALL (*)    Bacteria, UA RARE (*)    Squamous Epithelial / LPF 0-5 (*)    All other components  within normal limits  PROTIME-INR - Abnormal; Notable for the following:    Prothrombin Time 15.7 (*)    All other components within normal limits  URINE CULTURE  TROPONIN I  AMMONIA  ETHANOL   ____________________________________________  EKG  I personally interpreted any EKGs ordered by me or triage Trial fibrillation rate 64 bpm no acute ST elevation or acute ST depression ____________________________________________  RADIOLOGY  I reviewed any imaging ordered by me or triage that were performed during my shift and, if possible, patient and/or family made aware of any abnormal findings. ____________________________________________   PROCEDURES  Procedure(s) performed: None  Procedures  Critical Care performed: None  ____________________________________________  INITIAL IMPRESSION / ASSESSMENT AND PLAN / ED COURSE  Pertinent labs & imaging results that were available during my care of the patient were reviewed by me and considered in my medical decision making (see chart for details).  Are markedly well-appearing woman with no focal deficits, who is not confused here, in fact is at her baseline. Workup is unremarkable aside from a urinary tract infection which certainly is enough to cause her symptoms. We also noted that the patient was started on oxybutynin recently which notorious for causing confusion and elderly patients. I have discussed admission with the family but given that she is at her baseline and it is flu season they would very much prefer to take her home. I do not think this is unreasonable. They understands it limits mightily to continue to observe the patient. We are treating her urinary tract infection with IV Rocephin with culture pending. We will advise him to stop the oxybutynin. Return precautions and follow-up given and understood patient very comfortable with this plan as his family and she remains in no distress at her baseline. Creatinine is  somewhat elevated, not significantly elevated from prior we have given her IV fluids here. She is eating and drinking well.   ____________________________________________   FINAL CLINICAL IMPRESSION(S) / ED DIAGNOSES  Final diagnoses:  None      This chart was dictated using voice recognition software.  Despite best efforts to proofread,  errors can occur which can change meaning.      Schuyler Amor, MD 01/28/17 [phone]

## 2017-01-28 NOTE — Discharge Instructions (Signed)
Watch her carefully, if there is any change in her behavior that is concerning to, including chest pain shortness of breath nausea vomiting confusion or any other concerning symptoms please return to the emergency room. We strongly advised that you take no more of the oxybutynin. He would prefer not to be admitted to the hospital and that is not unreasonable but if there is any new or worrisome symptoms we do encourage you to return.

## 2017-01-28 NOTE — ED Notes (Signed)
Report from Kenneth, rn.

## 2017-01-28 NOTE — ED Notes (Signed)
Pt's family in room. Pt denies needs. Vss. Call bell at right side.

## 2017-01-28 NOTE — ED Triage Notes (Signed)
Pt with slurred speech that daughter noticed at 0700. Pt with hx stroke. In triage no slurred speech noted. No weakness noted. BP 212/65.

## 2017-01-30 LAB — URINE CULTURE

## 2017-02-02 DIAGNOSIS — A499 Bacterial infection, unspecified: Secondary | ICD-10-CM | POA: Diagnosis not present

## 2017-02-02 DIAGNOSIS — N390 Urinary tract infection, site not specified: Secondary | ICD-10-CM | POA: Diagnosis not present

## 2017-03-11 ENCOUNTER — Encounter: Payer: Self-pay | Admitting: Emergency Medicine

## 2017-03-11 ENCOUNTER — Emergency Department
Admission: EM | Admit: 2017-03-11 | Discharge: 2017-03-11 | Disposition: A | Discharge status: Home / Self Care | Payer: Medicare HMO | Attending: Emergency Medicine | Admitting: Emergency Medicine

## 2017-03-11 ENCOUNTER — Emergency Department: Payer: Medicare HMO

## 2017-03-11 DIAGNOSIS — Z7984 Long term (current) use of oral hypoglycemic drugs: Secondary | ICD-10-CM | POA: Diagnosis not present

## 2017-03-11 DIAGNOSIS — Z79899 Other long term (current) drug therapy: Secondary | ICD-10-CM | POA: Diagnosis not present

## 2017-03-11 DIAGNOSIS — R109 Unspecified abdominal pain: Secondary | ICD-10-CM | POA: Diagnosis not present

## 2017-03-11 DIAGNOSIS — E119 Type 2 diabetes mellitus without complications: Secondary | ICD-10-CM | POA: Insufficient documentation

## 2017-03-11 DIAGNOSIS — R319 Hematuria, unspecified: Secondary | ICD-10-CM | POA: Diagnosis not present

## 2017-03-11 DIAGNOSIS — I10 Essential (primary) hypertension: Secondary | ICD-10-CM | POA: Diagnosis not present

## 2017-03-11 HISTORY — DX: Cerebral infarction, unspecified: I63.9

## 2017-03-11 HISTORY — DX: Acute myocardial infarction, unspecified: I21.9

## 2017-03-11 LAB — BASIC METABOLIC PANEL
Anion gap: 6 (ref 5–15)
BUN: 30 mg/dL — AB (ref 6–20)
CALCIUM: 9.3 mg/dL (ref 8.9–10.3)
CO2: 25 mmol/L (ref 22–32)
Chloride: 106 mmol/L (ref 101–111)
Creatinine, Ser: 1.43 mg/dL — ABNORMAL HIGH (ref 0.44–1.00)
GFR calc Af Amer: 37 mL/min — ABNORMAL LOW (ref >60)
GFR, EST NON AFRICAN AMERICAN: 32 mL/min — AB (ref >60)
GLUCOSE: 171 mg/dL — AB (ref 65–99)
Potassium: 4.6 mmol/L (ref 3.5–5.1)
Sodium: 137 mmol/L (ref 135–145)

## 2017-03-11 LAB — CBC
HCT: 35.4 % (ref 35.0–47.0)
HEMOGLOBIN: 11.9 g/dL — AB (ref 12.0–16.0)
MCH: 29.9 pg (ref 26.0–34.0)
MCHC: 33.7 g/dL (ref 32.0–36.0)
MCV: 88.7 fL (ref 80.0–100.0)
Platelets: 201 10*3/uL (ref 150–440)
RBC: 3.99 MIL/uL (ref 3.80–5.20)
RDW: 14.2 % (ref 11.5–14.5)
WBC: 11 10*3/uL (ref 3.6–11.0)

## 2017-03-11 LAB — URINALYSIS, COMPLETE (UACMP) WITH MICROSCOPIC
Bilirubin Urine: NEGATIVE
GLUCOSE, UA: NEGATIVE mg/dL
Ketones, ur: NEGATIVE mg/dL
LEUKOCYTES UA: NEGATIVE
Nitrite: NEGATIVE
PROTEIN: 100 mg/dL — AB
SQUAMOUS EPITHELIAL / LPF: NONE SEEN
Specific Gravity, Urine: 1.013 (ref 1.005–1.030)
pH: 7 (ref 5.0–8.0)

## 2017-03-11 MED ORDER — DICLOFENAC SODIUM 1 % TD GEL
2.0000 g | Freq: Once | TRANSDERMAL | Status: AC
  Administered 2017-03-11: 21:00:00 2 g via TOPICAL
  Filled 2017-03-11: qty 100

## 2017-03-11 MED ORDER — DICLOFENAC SODIUM 3 % TD GEL: 1.0000 "application " | Freq: Two times a day (BID) | TRANSDERMAL | 0 refills | Status: AC | PRN

## 2017-03-11 NOTE — ED Notes (Addendum)
Pt states L sided back/flank pain that began yest. Extensive hx of UTI, always has frequency but has never had pain with UTI. Pt states no hx of kidney stones. Denies seeing blood in urine. Pt is alert, oriented, family at bedside. Denies N&V

## 2017-03-11 NOTE — ED Triage Notes (Signed)
Pt presents to ED via POV with c/o L flank pain with radiation into her back. Pt's daughter states she was treated for a UTI in February. Pt reports urinary frequency at this time.

## 2017-03-11 NOTE — ED Provider Notes (Addendum)
[facility] Emergency Department Provider Note  ____________________________________________   First MD Initiated Contact with Patient 03/11/17 1836     (approximate)  I have reviewed the triage vital signs and the nursing notes.   HISTORY  Chief Complaint Flank Pain and Back Pain   HPI Robin Morales is a 83 y.o. female with a history of diabetes and hypertension who is presenting to the emergency department today with left sided back pain. She says the pain started yesterday and hurts worse when she moves. She denies any heavy lifting or bending or injury that could've caused this. She says she has tried Tylenol at home for the pain without any relief. No history of kidney stones.Denies any radiation of the pain into the legs. Past Medical History:  Diagnosis Date  . Arthritis   . CVA (cerebral vascular accident) ([place])   . Diabetes mellitus without complication ([place])   . Hypercholesterolemia   . Hypertension   . MI (myocardial infarction)     Patient Active Problem List   Diagnosis Date Noted  . A-fib ([place]) 03/28/2016  . TIA (transient ischemic attack) 03/28/2016    Past Surgical History:  Procedure Laterality Date  . HIP ARTHROPLASTY      Prior to Admission medications   Medication Sig Start Date End Date Taking? Authorizing Provider  acetaminophen (TYLENOL) 500 MG tablet Take 500 mg by mouth every 6 (six) hours as needed.   Yes Historical Provider, MD  amLODipine (NORVASC) 2.5 MG tablet Take 1 tablet by mouth daily. 03/09/16  Yes Historical Provider, MD  apixaban (ELIQUIS) 2.5 MG TABS tablet Take 1 tablet (2.5 mg total) by mouth 2 (two) times daily. 03/29/16  Yes Vipul Manuella Ghazi, MD  atorvastatin (LIPITOR) 40 MG tablet Take 1 tablet (40 mg total) by mouth daily at 6 PM. 03/29/16  Yes Vipul Manuella Ghazi, MD  Chlorpheniramine-Acetaminophen (CORICIDIN HBP COLD/FLU PO) Take 1 tablet by mouth daily as needed.   Yes Historical Provider, MD    Diphenhydramine-PE-APAP (DELSYM COUGH/COLD NIGHT TIME PO) Take 1 Dose by mouth daily as needed.   Yes Historical Provider, MD  Magnesium Hydroxide (MAGNESIA PO) Take 25 mg by mouth daily.   Yes Historical Provider, MD  metoprolol (LOPRESSOR) 50 MG tablet Take 1 tablet by mouth 2 (two) times daily. 03/09/16  Yes Historical Provider, MD  omeprazole (PRILOSEC) 40 MG capsule Take 1 capsule by mouth daily. 03/18/16  Yes Historical Provider, MD  sodium bicarbonate 325 MG tablet Take 325 mg by mouth 4 (four) times daily.   Yes Historical Provider, MD  cephALEXin (KEFLEX) 500 MG capsule Take 1 capsule (500 mg total) by mouth 3 (three) times daily. Patient not taking: Reported on 03/11/2017 01/28/17   Schuyler Amor, MD  oxybutynin (DITROPAN) 5 MG tablet Take 5 mg by mouth 3 (three) times daily.    Historical Provider, MD    Allergies Keflex [cephalexin]  Family History  Problem Relation Age of Onset  . Hypertension Daughter   . Diabetes Sister     Social History Social History  Substance Use Topics  . Smoking status: Never Smoker  . Smokeless tobacco: Never Used  . Alcohol use No    Review of Systems Constitutional: No fever/chills Eyes: No visual changes. ENT: No sore throat. Cardiovascular: Denies chest pain. Respiratory: Denies shortness of breath. Gastrointestinal: No abdominal pain.  No nausea, no vomiting.  No diarrhea.  No constipation. Genitourinary: Negative for dysuria. Musculoskeletal: As above Skin: Negative for rash. Neurological: Negative for  headaches, focal weakness or numbness.  10-point ROS otherwise negative.  ____________________________________________   PHYSICAL EXAM:  VITAL SIGNS: ED Triage Vitals [03/11/17 1651]  Enc Vitals Group     BP (!) 195/65     Pulse Rate 83     Resp 18     Temp 98.3 F (36.8 C)     Temp Source Oral     SpO2 95 %     Weight 162 lb (73.5 kg)     Height 5' (1.524 m)     Head Circumference      Peak Flow      Pain Score 9      Pain Loc      Pain Edu?      Excl. in [place]?     Constitutional: Alert and oriented. Well appearing and in no acute distress. Eyes: Conjunctivae are normal. PERRL. EOMI. Head: Atraumatic. Nose: No congestion/rhinnorhea. Mouth/Throat: Mucous membranes are moist.   Neck: No stridor.   Cardiovascular: Normal rate, regular rhythm. Grossly normal heart sounds.   Respiratory: Normal respiratory effort.  No retractions. Lungs CTAB. Gastrointestinal: Soft and nontender. No distention.  Musculoskeletal: No lower extremity tenderness nor edema.  No joint effusions.  Tenderness to palpation to left low thoracic/high lumbar region. No midline tenderness, deformity or step-off. No crepitus or overlying erythema or vesicles.  Neurologic:  Normal speech and language. No gross focal neurologic deficits are appreciated. No gait instability. Skin:  Skin is warm, dry and intact. No rash noted. Psychiatric: Mood and affect are normal. Speech and behavior are normal.  ____________________________________________   LABS (all labs ordered are listed, but only abnormal results are displayed)  Labs Reviewed  URINALYSIS, COMPLETE (UACMP) WITH MICROSCOPIC - Abnormal; Notable for the following:       Result Value   Color, Urine STRAW (*)    APPearance CLEAR (*)    Hgb urine dipstick LARGE (*)    Protein, ur 100 (*)    Bacteria, UA RARE (*)    All other components within normal limits  BASIC METABOLIC PANEL - Abnormal; Notable for the following:    Glucose, Bld 171 (*)    BUN 30 (*)    Creatinine, Ser 1.43 (*)    GFR calc non Af Amer 32 (*)    GFR calc Af Amer 37 (*)    All other components within normal limits  CBC - Abnormal; Notable for the following:    Hemoglobin 11.9 (*)    All other components within normal limits   ____________________________________________  EKG   ____________________________________________  RADIOLOGY    CT RENAL STONE STUDY (Final result)  Result time  03/11/17 20:01:36  Final result by Logan Bores, MD (03/11/17 20:01:36)           Narrative:   CLINICAL DATA: Left flank pain.  EXAM: CT ABDOMEN AND PELVIS WITHOUT CONTRAST  TECHNIQUE: Multidetector CT imaging of the abdomen and pelvis was performed following the standard protocol without IV contrast.  COMPARISON: 04/15/2007  FINDINGS: Lower chest: Small right pleural effusion. Prominent extrapleural fat in the left lung base. Minimal bibasilar atelectasis. Right coronary artery calcification.  Hepatobiliary: No focal liver abnormality is seen. The tiny stones are questioned in the gallbladder. No biliary dilatation.  Pancreas: Fatty infiltration of the pancreas. No inflammation.  Spleen: Unremarkable.  Adrenals/Urinary Tract: 2.1 cm left adrenal nodule has enlarged but has an attenuation compatible with benign adenoma. Unremarkable right adrenal gland. Malrotated right kidney with mild perinephric soft tissue thickening and  punctate calcification. 1 cm right lower pole renal calculus, larger than on the prior study. No left renal calculi. No hydronephrosis. New mild left perinephric stranding. No ureteral calculi or ureteral dilatation. Unremarkable bladder.  Stomach/Bowel: The stomach is within normal limits. There is no evidence of bowel obstruction. Scattered colonic diverticulosis is noted without evidence of diverticulitis. Appendix is suboptimally visualized due to motion artifact through this region but is grossly unremarkable.  Vascular/Lymphatic: Extensive abdominal aortic atherosclerosis without aneurysm. No enlarged lymph nodes.  Reproductive: Uterus and bilateral adnexa are unremarkable.  Other: No intraperitoneal free fluid. No abdominal wall mass or hernia.  Musculoskeletal: Right hip arthroplasty. Advanced lower lumbar facet arthrosis, worse on the left.  IMPRESSION: 1. Nonobstructing right renal calculus, increased in size from 2008. 2. No  evidence of left-sided urinary tract calculi or obstruction. 3. New mild left perinephric stranding, nonspecific. Correlate with urinalysis for potential infection. 4. 2 cm left adrenal nodule, enlarged since 2008 but compatible with an adenoma. 5. Small right pleural effusion. 6. Aortic atherosclerosis.   Electronically Signed By: Logan Bores M.D. On: 03/11/2017 20:01          ____________________________________________   PROCEDURES  Procedure(s) performed:   Procedures  Critical Care performed:   ____________________________________________   INITIAL IMPRESSION / ASSESSMENT AND PLAN / ED COURSE  Pertinent labs & imaging results that were available during my care of the patient were reviewed by me and considered in my medical decision making (see chart for details).  ----------------------------------------- 8:23 PM on 03/11/2017 -----------------------------------------  Patient without any distress at this time. No acute findings on the CAT scan. We did review the findings included a kidney stone in the adrenal adenoma. Patient likely with musculoskeletal pain. I will give a prescription for diclofenac gel to apply topically twice a day for one week. The family is requesting a dose before the patient goes. I let family know that I would see if this is available at this time. There are also where the hematuria will be following up about this and her primary care doctor's office. Patient as well as the family understand the plan and willing to comply.      ____________________________________________   FINAL CLINICAL IMPRESSION(S) / ED DIAGNOSES  Final diagnoses:  Left flank pain   Hematuria   NEW MEDICATIONS STARTED DURING THIS VISIT:  New Prescriptions   No medications on file     Note:  This document was prepared using Dragon voice recognition software and may include unintentional dictation errors.    Orbie Pyo, MD 03/11/17  2024  Patient denies any bleeding in her stool. Because I'm prescribing NSAID while she is on eliquis she is aware to make sure to check for any increased bleeding and to stop the topical diclofenac immediately if she finds any bleed in her stool or any other side effects.     Orbie Pyo, MD 03/11/17 2033

## 2017-03-14 ENCOUNTER — Encounter: Payer: Self-pay | Admitting: Emergency Medicine

## 2017-03-14 ENCOUNTER — Emergency Department
Admission: EM | Admit: 2017-03-14 | Discharge: 2017-03-14 | Disposition: A | Discharge status: Home / Self Care | Payer: Medicare HMO | Attending: Emergency Medicine | Admitting: Emergency Medicine

## 2017-03-14 DIAGNOSIS — R109 Unspecified abdominal pain: Secondary | ICD-10-CM | POA: Diagnosis not present

## 2017-03-14 DIAGNOSIS — R3121 Asymptomatic microscopic hematuria: Secondary | ICD-10-CM | POA: Diagnosis not present

## 2017-03-14 DIAGNOSIS — M549 Dorsalgia, unspecified: Secondary | ICD-10-CM | POA: Diagnosis not present

## 2017-03-14 DIAGNOSIS — R319 Hematuria, unspecified: Secondary | ICD-10-CM | POA: Diagnosis not present

## 2017-03-14 DIAGNOSIS — I10 Essential (primary) hypertension: Secondary | ICD-10-CM | POA: Insufficient documentation

## 2017-03-14 DIAGNOSIS — E119 Type 2 diabetes mellitus without complications: Secondary | ICD-10-CM | POA: Insufficient documentation

## 2017-03-14 DIAGNOSIS — Z79899 Other long term (current) drug therapy: Secondary | ICD-10-CM | POA: Diagnosis not present

## 2017-03-14 LAB — URINALYSIS, COMPLETE (UACMP) WITH MICROSCOPIC
BILIRUBIN URINE: NEGATIVE
Bacteria, UA: NONE SEEN
GLUCOSE, UA: NEGATIVE mg/dL
KETONES UR: NEGATIVE mg/dL
LEUKOCYTES UA: NEGATIVE
NITRITE: NEGATIVE
PH: 7 (ref 5.0–8.0)
PROTEIN: NEGATIVE mg/dL
Specific Gravity, Urine: 1.006 (ref 1.005–1.030)

## 2017-03-14 LAB — COMPREHENSIVE METABOLIC PANEL
ALBUMIN: 4 g/dL (ref 3.5–5.0)
ALT: 23 U/L (ref 14–54)
AST: 32 U/L (ref 15–41)
Alkaline Phosphatase: 115 U/L (ref 38–126)
Anion gap: 8 (ref 5–15)
BUN: 28 mg/dL — AB (ref 6–20)
CALCIUM: 9.4 mg/dL (ref 8.9–10.3)
CO2: 25 mmol/L (ref 22–32)
CREATININE: 1.56 mg/dL — AB (ref 0.44–1.00)
Chloride: 102 mmol/L (ref 101–111)
GFR calc Af Amer: 33 mL/min — ABNORMAL LOW (ref >60)
GFR, EST NON AFRICAN AMERICAN: 29 mL/min — AB (ref >60)
Glucose, Bld: 137 mg/dL — ABNORMAL HIGH (ref 65–99)
POTASSIUM: 4.8 mmol/L (ref 3.5–5.1)
Sodium: 135 mmol/L (ref 135–145)
TOTAL PROTEIN: 7.8 g/dL (ref 6.5–8.1)
Total Bilirubin: 1.5 mg/dL — ABNORMAL HIGH (ref 0.3–1.2)

## 2017-03-14 LAB — CBC
HCT: 37.1 % (ref 35.0–47.0)
Hemoglobin: 12.5 g/dL (ref 12.0–16.0)
MCH: 29.8 pg (ref 26.0–34.0)
MCHC: 33.8 g/dL (ref 32.0–36.0)
MCV: 88.2 fL (ref 80.0–100.0)
PLATELETS: 250 10*3/uL (ref 150–440)
RBC: 4.2 MIL/uL (ref 3.80–5.20)
RDW: 14.5 % (ref 11.5–14.5)
WBC: 10.2 10*3/uL (ref 3.6–11.0)

## 2017-03-14 LAB — LIPASE, BLOOD: LIPASE: 14 U/L (ref 11–51)

## 2017-03-14 MED ORDER — FOSFOMYCIN TROMETHAMINE 3 G PO PACK
3.0000 g | PACK | Freq: Once | ORAL | Status: AC
  Administered 2017-03-14: 3 g via ORAL
  Filled 2017-03-14: qty 3

## 2017-03-14 MED ORDER — TRAMADOL HCL 50 MG PO TABS
100.0000 mg | ORAL_TABLET | Freq: Once | ORAL | Status: AC
  Administered 2017-03-14: 100 mg via ORAL
  Filled 2017-03-14: qty 2

## 2017-03-14 MED ORDER — TRAMADOL HCL 50 MG PO TABS: 50.0000 mg | ORAL_TABLET | Freq: Four times a day (QID) | ORAL | 0 refills | Status: AC | PRN

## 2017-03-14 NOTE — Discharge Instructions (Signed)
Please follow-up with your primary care doctor in next 1-2 days for recheck/reevaluation. Return to the emergency department for any acute worsening of pain, fever, or any other symptom personally concerning to yourself.

## 2017-03-14 NOTE — ED Provider Notes (Addendum)
[facility] Emergency Department Provider Note  Time seen: 7:29 AM  I have reviewed the triage vital signs and the nursing notes.   HISTORY  Chief Complaint Back Pain    HPI Robin Morales is a 83 y.o. female with a past medical history of arthritis, CVA, diabetes, hypertension, hyperlipidemia, presents to the emergency department for left flank pain. According to the patient for the past 4 days she has been experiencing pain in her left flank. Describes the pain as moderate and sharp. Denies any hematuria, dysuria, fever, nausea, vomiting, diarrhea. Patient was seen in the emergency department 3 days ago for the same found to have hematuria but an otherwise negative workup. Patient states the pain has continued so she came to the emergency department for reevaluation.  Past Medical History:  Diagnosis Date  . Arthritis   . CVA (cerebral vascular accident) ([place])   . Diabetes mellitus without complication ([place])   . Hypercholesterolemia   . Hypertension   . MI (myocardial infarction)     Patient Active Problem List   Diagnosis Date Noted  . A-fib ([place]) 03/28/2016  . TIA (transient ischemic attack) 03/28/2016    Past Surgical History:  Procedure Laterality Date  . HIP ARTHROPLASTY      Prior to Admission medications   Medication Sig Start Date End Date Taking? Authorizing Provider  acetaminophen (TYLENOL) 500 MG tablet Take 500 mg by mouth every 6 (six) hours as needed.    Historical Provider, MD  amLODipine (NORVASC) 2.5 MG tablet Take 1 tablet by mouth daily. 03/09/16   Historical Provider, MD  apixaban (ELIQUIS) 2.5 MG TABS tablet Take 1 tablet (2.5 mg total) by mouth 2 (two) times daily. 03/29/16   Max Sane, MD  atorvastatin (LIPITOR) 40 MG tablet Take 1 tablet (40 mg total) by mouth daily at 6 PM. 03/29/16   Max Sane, MD  cephALEXin (KEFLEX) 500 MG capsule Take 1 capsule (500 mg total) by mouth 3 (three) times daily. Patient not taking: Reported  on 03/11/2017 01/28/17   Schuyler Amor, MD  Chlorpheniramine-Acetaminophen (CORICIDIN HBP COLD/FLU PO) Take 1 tablet by mouth daily as needed.    Historical Provider, MD  Diclofenac Sodium 3 % GEL Place 1 application onto the skin every 12 (twelve) hours as needed. 03/11/17 03/18/17  Orbie Pyo, MD  Diphenhydramine-PE-APAP (DELSYM COUGH/COLD NIGHT TIME PO) Take 1 Dose by mouth daily as needed.    Historical Provider, MD  Magnesium Hydroxide (MAGNESIA PO) Take 25 mg by mouth daily.    Historical Provider, MD  metoprolol (LOPRESSOR) 50 MG tablet Take 1 tablet by mouth 2 (two) times daily. 03/09/16   Historical Provider, MD  omeprazole (PRILOSEC) 40 MG capsule Take 1 capsule by mouth daily. 03/18/16   Historical Provider, MD  oxybutynin (DITROPAN) 5 MG tablet Take 5 mg by mouth 3 (three) times daily.    Historical Provider, MD  sodium bicarbonate 325 MG tablet Take 325 mg by mouth 4 (four) times daily.    Historical Provider, MD    Allergies  Allergen Reactions  . Keflex [Cephalexin] Itching    Family History  Problem Relation Age of Onset  . Hypertension Daughter   . Diabetes Sister     Social History Social History  Substance Use Topics  . Smoking status: Never Smoker  . Smokeless tobacco: Never Used  . Alcohol use No    Review of Systems Constitutional: Negative for fever. Cardiovascular: Negative for chest pain. Respiratory: Negative for shortness  of breath. Gastrointestinal: Left flank pain. Negative for nausea vomiting or diarrhea Genitourinary: Negative for dysuria. Negative for gross hematuria Musculoskeletal: Positive for left flank pain Neurological: Negative for headache 10-point ROS otherwise negative.  ____________________________________________   PHYSICAL EXAM:  VITAL SIGNS: ED Triage Vitals [03/14/17 0639]  Enc Vitals Group     BP (!) 150/90     Pulse Rate 70     Resp 18     Temp 97.6 F (36.4 C)     Temp Source Oral     SpO2 96 %     Weight  162 lb (73.5 kg)     Height 5' (1.524 m)     Head Circumference      Peak Flow      Pain Score 10     Pain Loc      Pain Edu?      Excl. in [place]?     Constitutional: Alert. Well appearing and in no distress. Eyes: Normal exam ENT   Head: Normocephalic and atraumatic   Mouth/Throat: Mucous membranes are moist. Cardiovascular: Normal rate, regular rhythm.  Respiratory: Normal respiratory effort without tachypnea nor retractions. Breath sounds are clear  Gastrointestinal: Soft and nontender. No distention.  Moderate left CVA tenderness to palpation. Musculoskeletal: Nontender with normal range of motion in all extremities. Neurologic:  Normal speech and language. No gross focal neurologic deficits  Skin:  Skin is warm, dry and intact.  Psychiatric: Mood and affect are normal.   ____________________________________________   INITIAL IMPRESSION / ASSESSMENT AND PLAN / ED COURSE  Pertinent labs & imaging results that were available during my care of the patient were reviewed by me and considered in my medical decision making (see chart for details).  The patient presents the emergency department with left flank pain for the past 4 days. Patient was seen in the emergency department 03/11/17 for the same then have hematuria but an otherwise negative workup, including a CT renal scan, blood work. In reviewing the patient's workup from 03/11/17 she appeared to have mild left perinephric stranding, with hematuria but no signs of bacteria or white blood cells. This could possibly be due to an underlying infection as there is no other cause for the perinephric stranding identified on CT such as kidney stone, hydronephrosis, etc. We will recheck labs today including urinalysis. We will send a urine culture. If her workup is otherwise nonrevealing we will likely start the patient on antibiotics and monitor for improvement.  Labs are largely unchanged. Patient continues to have hematuria but no  other signs of urinary tract infection. Given the patient's nephric stranding along with hematuria we will cover with fosfomycin in the emergency department. A urine culture has been sent. We will discharge the patient on Ultram which she states has worked well for her pain in the emergency department. Patient will follow up with her primary care doctor this week.    ----------------------------------------- 11:32 AM on 03/14/2017 -----------------------------------------  Patient's daughter called several hours after discharge home. States the patient has become nauseated and had one episode of vomiting. It was greater than 2 hours since antibiotics were administered. Daughter states the patient is otherwise feeling well besides the nausea. I called the patient in a prescription for Zofran ODT to her [place]. However I also discussed with the daughter that if the patient continues to be nauseated or continues to vomit develops any fever or has any abdominal pain they are to return to the emergency department. She is agreeable.  ____________________________________________   FINAL CLINICAL IMPRESSION(S) / ED DIAGNOSES  Left flank pain Hematuria   Harvest Dark, MD 03/14/17 [id number]    Harvest Dark, MD 03/14/17 1133

## 2017-03-14 NOTE — ED Triage Notes (Signed)
Patient seen here recently on 03/11/17 for c/o the same (lower back pain on right side/right sided flank pain). Patient states she was given a rub for back pain at that time with no relief. Patient did have hematuria at last visit (not visible, but seen in lab work). Denies any current urinary symptoms. Patient denies any known injury, denies any fevers.

## 2017-03-15 LAB — URINE CULTURE: Culture: NO GROWTH

## 2017-03-19 DIAGNOSIS — G8929 Other chronic pain: Secondary | ICD-10-CM | POA: Diagnosis not present

## 2017-03-19 DIAGNOSIS — M545 Low back pain: Secondary | ICD-10-CM | POA: Diagnosis not present

## 2017-03-19 DIAGNOSIS — M546 Pain in thoracic spine: Secondary | ICD-10-CM | POA: Diagnosis not present

## 2017-04-03 DIAGNOSIS — E1121 Type 2 diabetes mellitus with diabetic nephropathy: Secondary | ICD-10-CM | POA: Diagnosis not present

## 2017-04-03 DIAGNOSIS — E785 Hyperlipidemia, unspecified: Secondary | ICD-10-CM | POA: Diagnosis not present

## 2017-04-03 DIAGNOSIS — M810 Age-related osteoporosis without current pathological fracture: Secondary | ICD-10-CM | POA: Diagnosis not present

## 2017-04-04 DIAGNOSIS — M810 Age-related osteoporosis without current pathological fracture: Secondary | ICD-10-CM | POA: Insufficient documentation

## 2017-04-10 DIAGNOSIS — Z0001 Encounter for general adult medical examination with abnormal findings: Secondary | ICD-10-CM | POA: Diagnosis not present

## 2017-04-10 DIAGNOSIS — E1340 Other specified diabetes mellitus with diabetic neuropathy, unspecified: Secondary | ICD-10-CM | POA: Diagnosis not present

## 2017-04-10 DIAGNOSIS — I10 Essential (primary) hypertension: Secondary | ICD-10-CM | POA: Diagnosis not present

## 2017-04-10 DIAGNOSIS — E1121 Type 2 diabetes mellitus with diabetic nephropathy: Secondary | ICD-10-CM | POA: Diagnosis not present

## 2017-04-10 DIAGNOSIS — I481 Persistent atrial fibrillation: Secondary | ICD-10-CM | POA: Diagnosis not present

## 2017-04-10 DIAGNOSIS — I2510 Atherosclerotic heart disease of native coronary artery without angina pectoris: Secondary | ICD-10-CM | POA: Diagnosis not present

## 2017-04-10 DIAGNOSIS — E7800 Pure hypercholesterolemia, unspecified: Secondary | ICD-10-CM | POA: Diagnosis not present

## 2017-04-10 DIAGNOSIS — M810 Age-related osteoporosis without current pathological fracture: Secondary | ICD-10-CM | POA: Diagnosis not present

## 2017-06-06 DIAGNOSIS — E1140 Type 2 diabetes mellitus with diabetic neuropathy, unspecified: Secondary | ICD-10-CM | POA: Diagnosis not present

## 2017-06-06 DIAGNOSIS — J069 Acute upper respiratory infection, unspecified: Secondary | ICD-10-CM | POA: Diagnosis not present

## 2017-06-06 DIAGNOSIS — I481 Persistent atrial fibrillation: Secondary | ICD-10-CM | POA: Diagnosis not present

## 2017-07-10 DIAGNOSIS — E1121 Type 2 diabetes mellitus with diabetic nephropathy: Secondary | ICD-10-CM | POA: Diagnosis not present

## 2017-07-17 DIAGNOSIS — I10 Essential (primary) hypertension: Secondary | ICD-10-CM | POA: Diagnosis not present

## 2017-07-17 DIAGNOSIS — E7800 Pure hypercholesterolemia, unspecified: Secondary | ICD-10-CM | POA: Diagnosis not present

## 2017-07-17 DIAGNOSIS — E1121 Type 2 diabetes mellitus with diabetic nephropathy: Secondary | ICD-10-CM | POA: Diagnosis not present

## 2017-07-17 DIAGNOSIS — I481 Persistent atrial fibrillation: Secondary | ICD-10-CM | POA: Diagnosis not present

## 2017-10-09 DIAGNOSIS — E1121 Type 2 diabetes mellitus with diabetic nephropathy: Secondary | ICD-10-CM | POA: Diagnosis not present

## 2017-10-16 DIAGNOSIS — I10 Essential (primary) hypertension: Secondary | ICD-10-CM | POA: Diagnosis not present

## 2017-10-16 DIAGNOSIS — Z23 Encounter for immunization: Secondary | ICD-10-CM | POA: Diagnosis not present

## 2017-10-16 DIAGNOSIS — I481 Persistent atrial fibrillation: Secondary | ICD-10-CM | POA: Diagnosis not present

## 2017-10-16 DIAGNOSIS — E1121 Type 2 diabetes mellitus with diabetic nephropathy: Secondary | ICD-10-CM | POA: Diagnosis not present

## 2017-10-16 DIAGNOSIS — M19011 Primary osteoarthritis, right shoulder: Secondary | ICD-10-CM | POA: Diagnosis not present

## 2017-10-16 DIAGNOSIS — E7800 Pure hypercholesterolemia, unspecified: Secondary | ICD-10-CM | POA: Diagnosis not present

## 2017-10-16 DIAGNOSIS — R350 Frequency of micturition: Secondary | ICD-10-CM | POA: Diagnosis not present

## 2017-10-16 DIAGNOSIS — M25511 Pain in right shoulder: Secondary | ICD-10-CM | POA: Diagnosis not present

## 2018-01-09 DIAGNOSIS — E1121 Type 2 diabetes mellitus with diabetic nephropathy: Secondary | ICD-10-CM | POA: Diagnosis not present

## 2018-01-16 DIAGNOSIS — E1121 Type 2 diabetes mellitus with diabetic nephropathy: Secondary | ICD-10-CM | POA: Diagnosis not present

## 2018-01-16 DIAGNOSIS — K219 Gastro-esophageal reflux disease without esophagitis: Secondary | ICD-10-CM | POA: Diagnosis not present

## 2018-01-16 DIAGNOSIS — I10 Essential (primary) hypertension: Secondary | ICD-10-CM | POA: Diagnosis not present

## 2018-01-16 DIAGNOSIS — I481 Persistent atrial fibrillation: Secondary | ICD-10-CM | POA: Diagnosis not present

## 2018-01-16 DIAGNOSIS — E7800 Pure hypercholesterolemia, unspecified: Secondary | ICD-10-CM | POA: Diagnosis not present

## 2018-01-16 DIAGNOSIS — Z1329 Encounter for screening for other suspected endocrine disorder: Secondary | ICD-10-CM | POA: Diagnosis not present

## 2018-02-20 DIAGNOSIS — F0280 Dementia in other diseases classified elsewhere without behavioral disturbance: Secondary | ICD-10-CM | POA: Diagnosis not present

## 2018-02-20 DIAGNOSIS — G988 Other disorders of nervous system: Secondary | ICD-10-CM | POA: Diagnosis not present

## 2018-02-20 DIAGNOSIS — R443 Hallucinations, unspecified: Secondary | ICD-10-CM | POA: Diagnosis not present

## 2018-03-25 DIAGNOSIS — G3183 Dementia with Lewy bodies: Secondary | ICD-10-CM | POA: Diagnosis not present

## 2018-03-25 DIAGNOSIS — E1340 Other specified diabetes mellitus with diabetic neuropathy, unspecified: Secondary | ICD-10-CM | POA: Diagnosis not present

## 2018-03-25 DIAGNOSIS — G479 Sleep disorder, unspecified: Secondary | ICD-10-CM | POA: Diagnosis not present

## 2018-03-25 DIAGNOSIS — F0281 Dementia in other diseases classified elsewhere with behavioral disturbance: Secondary | ICD-10-CM | POA: Diagnosis not present

## 2018-03-25 DIAGNOSIS — I6930 Unspecified sequelae of cerebral infarction: Secondary | ICD-10-CM | POA: Diagnosis not present

## 2018-03-26 ENCOUNTER — Other Ambulatory Visit: Payer: Self-pay | Admitting: Neurology

## 2018-03-26 DIAGNOSIS — F0281 Dementia in other diseases classified elsewhere with behavioral disturbance: Secondary | ICD-10-CM

## 2018-03-26 DIAGNOSIS — F02818 Dementia in other diseases classified elsewhere, unspecified severity, with other behavioral disturbance: Secondary | ICD-10-CM

## 2018-03-26 DIAGNOSIS — G3183 Dementia with Lewy bodies: Principal | ICD-10-CM

## 2018-03-29 ENCOUNTER — Ambulatory Visit
Admission: RE | Admit: 2018-03-29 | Discharge: 2018-03-29 | Disposition: A | Discharge status: Home / Self Care | Payer: Medicare HMO | Source: Ambulatory Visit | Attending: Neurology | Admitting: Neurology

## 2018-03-29 DIAGNOSIS — F0281 Dementia in other diseases classified elsewhere with behavioral disturbance: Secondary | ICD-10-CM | POA: Diagnosis not present

## 2018-03-29 DIAGNOSIS — G3183 Dementia with Lewy bodies: Secondary | ICD-10-CM | POA: Insufficient documentation

## 2018-03-29 DIAGNOSIS — F0391 Unspecified dementia with behavioral disturbance: Secondary | ICD-10-CM | POA: Diagnosis not present

## 2018-04-04 DIAGNOSIS — Z1329 Encounter for screening for other suspected endocrine disorder: Secondary | ICD-10-CM | POA: Diagnosis not present

## 2018-04-04 DIAGNOSIS — E1121 Type 2 diabetes mellitus with diabetic nephropathy: Secondary | ICD-10-CM | POA: Diagnosis not present

## 2018-04-04 DIAGNOSIS — E7800 Pure hypercholesterolemia, unspecified: Secondary | ICD-10-CM | POA: Diagnosis not present

## 2018-04-11 DIAGNOSIS — I10 Essential (primary) hypertension: Secondary | ICD-10-CM | POA: Diagnosis not present

## 2018-04-11 DIAGNOSIS — Z0001 Encounter for general adult medical examination with abnormal findings: Secondary | ICD-10-CM | POA: Diagnosis not present

## 2018-04-11 DIAGNOSIS — M545 Low back pain: Secondary | ICD-10-CM | POA: Diagnosis not present

## 2018-04-11 DIAGNOSIS — N184 Chronic kidney disease, stage 4 (severe): Secondary | ICD-10-CM | POA: Diagnosis not present

## 2018-04-11 DIAGNOSIS — I481 Persistent atrial fibrillation: Secondary | ICD-10-CM | POA: Diagnosis not present

## 2018-04-11 DIAGNOSIS — R319 Hematuria, unspecified: Secondary | ICD-10-CM | POA: Diagnosis not present

## 2018-04-11 DIAGNOSIS — Z0000 Encounter for general adult medical examination without abnormal findings: Secondary | ICD-10-CM | POA: Diagnosis not present

## 2018-04-11 DIAGNOSIS — E1121 Type 2 diabetes mellitus with diabetic nephropathy: Secondary | ICD-10-CM | POA: Diagnosis not present

## 2018-04-23 DIAGNOSIS — M545 Low back pain: Secondary | ICD-10-CM | POA: Diagnosis not present

## 2018-04-26 DIAGNOSIS — M545 Low back pain: Secondary | ICD-10-CM | POA: Diagnosis not present

## 2018-05-14 ENCOUNTER — Ambulatory Visit (INDEPENDENT_AMBULATORY_CARE_PROVIDER_SITE_OTHER): Payer: Medicare HMO | Admitting: Urology

## 2018-05-14 ENCOUNTER — Encounter: Payer: Self-pay | Admitting: Urology

## 2018-05-14 VITALS — BP 198/60 | HR 53 | Ht 61.0 in | Wt 160.0 lb

## 2018-05-14 DIAGNOSIS — R350 Frequency of micturition: Secondary | ICD-10-CM

## 2018-05-14 DIAGNOSIS — R3129 Other microscopic hematuria: Secondary | ICD-10-CM | POA: Diagnosis not present

## 2018-05-14 DIAGNOSIS — N200 Calculus of kidney: Secondary | ICD-10-CM

## 2018-05-14 LAB — MICROSCOPIC EXAMINATION
BACTERIA UA: NONE SEEN
RBC, UA: >30 /hpf — ABNORMAL HIGH (ref 0–2)

## 2018-05-14 LAB — URINALYSIS, COMPLETE
Bilirubin, UA: NEGATIVE
GLUCOSE, UA: NEGATIVE
KETONES UA: NEGATIVE
LEUKOCYTES UA: NEGATIVE
Nitrite, UA: NEGATIVE
SPEC GRAV UA: 1.015 (ref 1.005–1.030)
Urobilinogen, Ur: 0.2 mg/dL (ref 0.2–1.0)
pH, UA: 7.5 (ref 5.0–7.5)

## 2018-05-14 NOTE — Progress Notes (Signed)
05/14/2018 9:59 AM   Robin Morales 1926/01/20 [id number]  Referring provider: Baxter Hire, MD [place] [address]  Chief Complaint  Patient presents with  . Hematuria    New Patient    HPI: Pleasant 84 year old woman referred for further evaluation of microscopic hematuria.  Review of records note that this is been present on multiple urinalyses dating back to at least 2017.  This is typically in the absence of evidence of infection.  She denies gross hematuria.  She is on chronic anticoagulation for A. fib.  Patient is somewhat of a poor historian.  She was not aware of kidney stones.  On review of records, she did have a CT scan in 02/2017 which was performed at the time of an emergency room visit for left flank pain.  At that time, she was noted to have a right lower pole stone dating back to at least 2008 at which time the stone was 7 mm.  Over 10 years, it has enlarged to 10 mm.    She denies any recent episodes of flank pain.  She does have some chronic low and mid back pain.  Today, she does complain of urinary frequency and urgency.  This is very bothersome to her.  This is been going on for quite some time.  She also has episodes of urge incontinence.  Risk factors include diabetes with neuropathy.  She denies any gross hematuria, dysuria, or recent urinary tract infections.  PMH: Past Medical History:  Diagnosis Date  . A-fib ([place]) 03/28/2016  . Arthritis   . CAD (coronary artery disease) 07/10/2014  . Chronic ischemic left PCA stroke 04/14/2016   Overview:  Left PCA  . CVA (cerebral vascular accident) ([place])   . Diabetes mellitus without complication ([place])   . Gastroesophageal reflux disease without esophagitis 07/05/2015  . Hypercholesterolemia   . Hyperlipidemia 07/10/2014  . Hypertension   . MI (myocardial infarction) ([place])   . TIA (transient ischemic attack) 03/28/2016  . Type 2 diabetes mellitus with diabetic nephropathy ([place])  01/14/2016    Surgical History: Past Surgical History:  Procedure Laterality Date  . HIP ARTHROPLASTY      Home Medications:  Allergies as of 05/14/2018      Reactions   Gabapentin    Other reaction(s): Dizziness   Keflex [cephalexin] Itching      Medication List        Accurate as of 05/14/18 11:59 PM. Always use your most recent med list.          acetaminophen 500 MG tablet Commonly known as:  TYLENOL Take 500 mg by mouth every 6 (six) hours as needed.   alendronate 70 MG tablet Commonly known as:  FOSAMAX   amLODipine 2.5 MG tablet Commonly known as:  NORVASC Take 1 tablet by mouth daily.   apixaban 2.5 MG Tabs tablet Commonly known as:  ELIQUIS Take 1 tablet (2.5 mg total) by mouth 2 (two) times daily.   atorvastatin 40 MG tablet Commonly known as:  LIPITOR Take 1 tablet (40 mg total) by mouth daily at 6 PM.   CORICIDIN HBP COLD/FLU PO Take 1 tablet by mouth daily as needed.   DELSYM COUGH/COLD NIGHT TIME PO Take 1 Dose by mouth daily as needed.   glipiZIDE 5 MG tablet Commonly known as:  GLUCOTROL   MAGNESIA PO Take 25 mg by mouth daily.   meloxicam 15 MG tablet Commonly known as:  MOBIC   metoprolol tartrate  50 MG tablet Commonly known as:  LOPRESSOR Take 1 tablet by mouth 2 (two) times daily.   omeprazole 40 MG capsule Commonly known as:  PRILOSEC Take 1 capsule by mouth daily.   sodium bicarbonate 325 MG tablet Take 325 mg by mouth 4 (four) times daily.   traZODone 100 MG tablet Commonly known as:  DESYREL       Allergies:  Allergies  Allergen Reactions  . Gabapentin     Other reaction(s): Dizziness  . Keflex [Cephalexin] Itching    Family History: Family History  Problem Relation Age of Onset  . Hypertension Daughter   . Diabetes Sister   . Prostate cancer Neg Hx     Social History:  reports that she has never smoked. She has never used smokeless tobacco. She reports that she does not drink alcohol or use  drugs.  ROS: UROLOGY Frequent Urination?: Yes Hard to postpone urination?: No Burning/pain with urination?: No Get up at night to urinate?: No Leakage of urine?: Yes Urine stream starts and stops?: No Trouble starting stream?: No Do you have to strain to urinate?: No Blood in urine?: Yes Urinary tract infection?: Yes Sexually transmitted disease?: No Injury to kidneys or bladder?: No Painful intercourse?: No Weak stream?: No Currently pregnant?: No Vaginal bleeding?: No Last menstrual period?: n  Gastrointestinal Nausea?: No Vomiting?: No Indigestion/heartburn?: No Diarrhea?: No Constipation?: No  Constitutional Fever: No Night sweats?: No Weight loss?: No Fatigue?: No  Skin Skin rash/lesions?: No Itching?: No  Eyes Blurred vision?: No Double vision?: No  Ears/Nose/Throat Sore throat?: No Sinus problems?: No  Hematologic/Lymphatic Swollen glands?: No Easy bruising?: No  Cardiovascular Leg swelling?: No Chest pain?: No  Respiratory Cough?: No Shortness of breath?: No  Endocrine Excessive thirst?: No  Musculoskeletal Back pain?: Yes Joint pain?: No  Neurological Headaches?: No Dizziness?: No  Psychologic Depression?: No Anxiety?: No  Physical Exam: BP (!) 198/60   Pulse (!) 53   Ht 5\' 1"  (1.549 m)   Wt 160 lb (72.6 kg)   BMI 30.23 kg/m   Constitutional:  Alert and oriented, No acute distress.  Elderly, frail.  Accompanied today by daughter. HEENT: [place] AT, moist mucus membranes.  Trachea midline, no masses. Cardiovascular: No clubbing, cyanosis, or edema. Respiratory: Normal respiratory effort, no increased work of breathing. GI: Abdomen is soft, nontender, nondistended, no abdominal masses GU: No CVA tenderness Skin: No rashes, bruises or suspicious lesions. Neurologic: Grossly intact, no focal deficits, moving all 4 extremities. Psychiatric: Normal mood and affect.  Laboratory Data: Lab Results  Component Value Date   WBC  10.2 03/14/2017   HGB 12.5 03/14/2017   HCT 37.1 03/14/2017   MCV 88.2 03/14/2017   PLT 250 03/14/2017    Lab Results  Component Value Date   CREATININE 1.56 (H) 03/14/2017     Lab Results  Component Value Date   HGBA1C 5.8 03/28/2016    Urinalysis Results for orders placed or performed in visit on 05/14/18  Microscopic Examination  Result Value Ref Range   WBC, UA 0-5 0 - 5 /hpf   RBC, UA >30 (H) 0 - 2 /hpf   Epithelial Cells (non renal) 0-10 0 - 10 /hpf   Mucus, UA Present (A) Not Estab.   Bacteria, UA None seen None seen/Few  Urinalysis, Complete  Result Value Ref Range   Specific Gravity, UA 1.015 1.005 - 1.030   pH, UA 7.5 5.0 - 7.5   Color, UA Yellow Yellow   Appearance Ur Cloudy (  A) Clear   Leukocytes, UA Negative Negative   Protein, UA Trace (A) Negative/Trace   Glucose, UA Negative Negative   Ketones, UA Negative Negative   RBC, UA 3+ (A) Negative   Bilirubin, UA Negative Negative   Urobilinogen, Ur 0.2 0.2 - 1.0 mg/dL   Nitrite, UA Negative Negative   Microscopic Examination See below:     Pertinent Imaging: Results for orders placed during the hospital encounter of 03/11/17  CT RENAL STONE STUDY   Narrative CLINICAL DATA:  Left flank pain.  EXAM: CT ABDOMEN AND PELVIS WITHOUT CONTRAST  TECHNIQUE: Multidetector CT imaging of the abdomen and pelvis was performed following the standard protocol without IV contrast.  COMPARISON:  04/15/2007  FINDINGS: Lower chest: Small right pleural effusion. Prominent extrapleural fat in the left lung base. Minimal bibasilar atelectasis. Right coronary artery calcification.  Hepatobiliary: No focal liver abnormality is seen. The tiny stones are questioned in the gallbladder. No biliary dilatation.  Pancreas: Fatty infiltration of the pancreas.  No inflammation.  Spleen: Unremarkable.  Adrenals/Urinary Tract: 2.1 cm left adrenal nodule has enlarged but has an attenuation compatible with benign adenoma.  Unremarkable right adrenal gland. Malrotated right kidney with mild perinephric soft tissue thickening and punctate calcification. 1 cm right lower pole renal calculus, larger than on the prior study. No left renal calculi. No hydronephrosis. New mild left perinephric stranding. No ureteral calculi or ureteral dilatation. Unremarkable bladder.  Stomach/Bowel: The stomach is within normal limits. There is no evidence of bowel obstruction. Scattered colonic diverticulosis is noted without evidence of diverticulitis. Appendix is suboptimally visualized due to motion artifact through this region but is grossly unremarkable.  Vascular/Lymphatic: Extensive abdominal aortic atherosclerosis without aneurysm. No enlarged lymph nodes.  Reproductive: Uterus and bilateral adnexa are unremarkable.  Other: No intraperitoneal free fluid. No abdominal wall mass or hernia.  Musculoskeletal: Right hip arthroplasty. Advanced lower lumbar facet arthrosis, worse on the left.  IMPRESSION: 1. Nonobstructing right renal calculus, increased in size from 2008. 2. No evidence of left-sided urinary tract calculi or obstruction. 3. New mild left perinephric stranding, nonspecific. Correlate with urinalysis for potential infection. 4. 2 cm left adrenal nodule, enlarged since 2008 but compatible with an adenoma. 5. Small right pleural effusion. 6. Aortic atherosclerosis.   Electronically Signed   By: Logan Bores M.D.   On: 03/11/2017 20:01    CT renal ct personally today and with the patient.   Assessment & Plan:    1. Microscopic hematuria Microscopic hematuria at least since 2017 May be related to stone Differential diagnosis discussed Given her age and comorbidities, she was offered office cystoscopy but if tumor or some other pathology is identified, she expressed that she would NOT be interested in pursuing any operating room as such, will defer cystoscopy unless she changes her mind We will  provide information regarding office cystoscopy if she changes her mind  Given her age and wishes, this is quite reasonable - Urinalysis, Complete  2. Right kidney stone Slight interval growth since that time but otherwise asymptomatic Would not recommend any further surveillance or intervention given her age and comorbidities 1 cm right lower pole nonobstructing calculus Appears to be chronic, dating back to at least 2006  3. Urinary frequency Urinary incontinence This is bothersome to her Discussed behavioral modification Given samples of Myrbetriq 25 mg x 4 weeks today, advised that this can raise her blood pressure so if she chooses to continue this medication, would like her to come in for nurse visit  for BP check/ PVR Would avoid anticholinergics in this elderly patient  F/u prn  Hollice Espy, MD  [facility] [address], [place], [place] [address] [phone]  I spent 45 min with this patient of which greater than 50% was spent in counseling and coordination of care with the patient.

## 2018-05-14 NOTE — Patient Instructions (Signed)
Cystoscopy  Cystoscopy is a procedure that is used to help diagnose and sometimes treat conditions that affect that lower urinary tract. The lower urinary tract includes the bladder and the tube that drains urine from the bladder out of the body (urethra). Cystoscopy is performed with a thin, tube-shaped instrument with a light and camera at the end (cystoscope). The cystoscope may be hard (rigid) or flexible, depending on the goal of the procedure.The cystoscope is inserted through the urethra, into the bladder.  Cystoscopy may be recommended if you have:   Urinary tractinfections that keep coming back (recurring).   Blood in the urine (hematuria).   Loss of bladder control (urinary incontinence) or an overactive bladder.   Unusual cells found in a urine sample.   A blockage in the urethra.   Painful urination.   An abnormality in the bladder found during an intravenous pyelogram (IVP) or CT scan.    Cystoscopy may also be done to remove a sample of tissue to be examined under a microscope (biopsy).  Tell a health care provider about:   Any allergies you have.   All medicines you are taking, including vitamins, herbs, eye drops, creams, and over-the-counter medicines.   Any problems you or family members have had with anesthetic medicines.   Any blood disorders you have.   Any surgeries you have had.   Any medical conditions you have.   Whether you are pregnant or may be pregnant.  What are the risks?  Generally, this is a safe procedure. However, problems may occur, including:   Infection.   Bleeding.   Allergic reactions to medicines.   Damage to other structures or organs.    What happens before the procedure?   Ask your health care provider about:  ? Changing or stopping your regular medicines. This is especially important if you are taking diabetes medicines or blood thinners.  ? Taking medicines such as aspirin and ibuprofen. These medicines can thin your blood. Do not take these medicines  before your procedure if your health care provider instructs you not to.   Follow instructions from your health care provider about eating or drinking restrictions.   You may be given antibiotic medicine to help prevent infection.   You may have an exam or testing, such as X-rays of the bladder, urethra, or kidneys.   You may have urine tests to check for signs of infection.   Plan to have someone take you home after the procedure.  What happens during the procedure?   To reduce your risk of infection,your health care team will wash or sanitize their hands.   You will be given one or more of the following:  ? A medicine to help you relax (sedative).  ? A medicine to numb the area (local anesthetic).   The area around the opening of your urethra will be cleaned.   The cystoscope will be passed through your urethra into your bladder.   Germ-free (sterile)fluid will flow through the cystoscope to fill your bladder. The fluid will stretch your bladder so that your surgeon can clearly examine your bladder walls.   The cystoscope will be removed and your bladder will be emptied.  The procedure may vary among health care providers and hospitals.  What happens after the procedure?   You may have some soreness or pain in your abdomen and urethra. Medicines will be available to help you.   You may have some blood in your urine.   Do not   drive for 24 hours if you received a sedative.  This information is not intended to replace advice given to you by your health care provider. Make sure you discuss any questions you have with your health care provider.  Document Released: 12/08/2000 Document Revised: 04/20/2016 Document Reviewed: 10/28/2015  [employer] Interactive Patient Education  2018 [employer]

## 2018-05-15 ENCOUNTER — Ambulatory Visit: Payer: Self-pay | Admitting: Urology

## 2018-07-11 DIAGNOSIS — E1121 Type 2 diabetes mellitus with diabetic nephropathy: Secondary | ICD-10-CM | POA: Diagnosis not present

## 2018-07-12 DIAGNOSIS — E1121 Type 2 diabetes mellitus with diabetic nephropathy: Secondary | ICD-10-CM | POA: Diagnosis not present

## 2018-07-22 DIAGNOSIS — I481 Persistent atrial fibrillation: Secondary | ICD-10-CM | POA: Diagnosis not present

## 2018-07-22 DIAGNOSIS — E1340 Other specified diabetes mellitus with diabetic neuropathy, unspecified: Secondary | ICD-10-CM | POA: Diagnosis not present

## 2018-07-22 DIAGNOSIS — M545 Low back pain: Secondary | ICD-10-CM | POA: Diagnosis not present

## 2018-07-22 DIAGNOSIS — I2510 Atherosclerotic heart disease of native coronary artery without angina pectoris: Secondary | ICD-10-CM | POA: Diagnosis not present

## 2018-07-22 DIAGNOSIS — E1121 Type 2 diabetes mellitus with diabetic nephropathy: Secondary | ICD-10-CM | POA: Diagnosis not present

## 2018-07-22 DIAGNOSIS — I10 Essential (primary) hypertension: Secondary | ICD-10-CM | POA: Diagnosis not present

## 2018-07-22 DIAGNOSIS — E7800 Pure hypercholesterolemia, unspecified: Secondary | ICD-10-CM | POA: Diagnosis not present

## 2018-09-30 DIAGNOSIS — Z23 Encounter for immunization: Secondary | ICD-10-CM | POA: Diagnosis not present

## 2018-09-30 DIAGNOSIS — L03115 Cellulitis of right lower limb: Secondary | ICD-10-CM | POA: Diagnosis not present

## 2018-09-30 DIAGNOSIS — W01198A Fall on same level from slipping, tripping and stumbling with subsequent striking against other object, initial encounter: Secondary | ICD-10-CM | POA: Diagnosis not present

## 2018-09-30 DIAGNOSIS — S8011XA Contusion of right lower leg, initial encounter: Secondary | ICD-10-CM | POA: Diagnosis not present

## 2018-10-18 DIAGNOSIS — I10 Essential (primary) hypertension: Secondary | ICD-10-CM | POA: Diagnosis not present

## 2018-10-18 DIAGNOSIS — E1121 Type 2 diabetes mellitus with diabetic nephropathy: Secondary | ICD-10-CM | POA: Diagnosis not present

## 2018-10-29 DIAGNOSIS — I10 Essential (primary) hypertension: Secondary | ICD-10-CM | POA: Diagnosis not present

## 2018-10-29 DIAGNOSIS — N390 Urinary tract infection, site not specified: Secondary | ICD-10-CM | POA: Diagnosis not present

## 2018-10-29 DIAGNOSIS — I4891 Unspecified atrial fibrillation: Secondary | ICD-10-CM | POA: Diagnosis not present

## 2018-10-29 DIAGNOSIS — E7800 Pure hypercholesterolemia, unspecified: Secondary | ICD-10-CM | POA: Diagnosis not present

## 2018-10-29 DIAGNOSIS — E875 Hyperkalemia: Secondary | ICD-10-CM | POA: Diagnosis not present

## 2018-10-29 DIAGNOSIS — E1340 Other specified diabetes mellitus with diabetic neuropathy, unspecified: Secondary | ICD-10-CM | POA: Diagnosis not present

## 2018-10-29 DIAGNOSIS — I2510 Atherosclerotic heart disease of native coronary artery without angina pectoris: Secondary | ICD-10-CM | POA: Diagnosis not present

## 2018-10-29 DIAGNOSIS — E1121 Type 2 diabetes mellitus with diabetic nephropathy: Secondary | ICD-10-CM | POA: Diagnosis not present

## 2018-10-29 DIAGNOSIS — G301 Alzheimer's disease with late onset: Secondary | ICD-10-CM | POA: Diagnosis not present

## 2018-11-12 DIAGNOSIS — E875 Hyperkalemia: Secondary | ICD-10-CM | POA: Diagnosis not present

## 2018-11-25 DIAGNOSIS — E875 Hyperkalemia: Secondary | ICD-10-CM | POA: Diagnosis not present

## 2018-12-03 ENCOUNTER — Emergency Department: Payer: Medicare HMO

## 2018-12-03 ENCOUNTER — Other Ambulatory Visit: Payer: Self-pay

## 2018-12-03 ENCOUNTER — Inpatient Hospital Stay
Admission: EM | Admit: 2018-12-03 | Discharge: 2018-12-08 | DRG: 872 | Disposition: A | Discharge status: Home Health | Payer: Medicare HMO | Attending: Internal Medicine | Admitting: Internal Medicine

## 2018-12-03 DIAGNOSIS — I77811 Abdominal aortic ectasia: Secondary | ICD-10-CM | POA: Diagnosis present

## 2018-12-03 DIAGNOSIS — Z888 Allergy status to other drugs, medicaments and biological substances status: Secondary | ICD-10-CM

## 2018-12-03 DIAGNOSIS — R062 Wheezing: Secondary | ICD-10-CM | POA: Diagnosis not present

## 2018-12-03 DIAGNOSIS — K8010 Calculus of gallbladder with chronic cholecystitis without obstruction: Secondary | ICD-10-CM | POA: Diagnosis present

## 2018-12-03 DIAGNOSIS — R6520 Severe sepsis without septic shock: Secondary | ICD-10-CM | POA: Diagnosis not present

## 2018-12-03 DIAGNOSIS — R0689 Other abnormalities of breathing: Secondary | ICD-10-CM | POA: Diagnosis not present

## 2018-12-03 DIAGNOSIS — E785 Hyperlipidemia, unspecified: Secondary | ICD-10-CM | POA: Diagnosis present

## 2018-12-03 DIAGNOSIS — E7800 Pure hypercholesterolemia, unspecified: Secondary | ICD-10-CM | POA: Diagnosis present

## 2018-12-03 DIAGNOSIS — Z7984 Long term (current) use of oral hypoglycemic drugs: Secondary | ICD-10-CM

## 2018-12-03 DIAGNOSIS — K219 Gastro-esophageal reflux disease without esophagitis: Secondary | ICD-10-CM | POA: Diagnosis present

## 2018-12-03 DIAGNOSIS — I959 Hypotension, unspecified: Secondary | ICD-10-CM | POA: Diagnosis not present

## 2018-12-03 DIAGNOSIS — Z8249 Family history of ischemic heart disease and other diseases of the circulatory system: Secondary | ICD-10-CM | POA: Diagnosis not present

## 2018-12-03 DIAGNOSIS — N189 Chronic kidney disease, unspecified: Secondary | ICD-10-CM

## 2018-12-03 DIAGNOSIS — R339 Retention of urine, unspecified: Secondary | ICD-10-CM | POA: Diagnosis not present

## 2018-12-03 DIAGNOSIS — R05 Cough: Secondary | ICD-10-CM | POA: Diagnosis not present

## 2018-12-03 DIAGNOSIS — A419 Sepsis, unspecified organism: Secondary | ICD-10-CM | POA: Diagnosis not present

## 2018-12-03 DIAGNOSIS — K810 Acute cholecystitis: Secondary | ICD-10-CM

## 2018-12-03 DIAGNOSIS — N390 Urinary tract infection, site not specified: Secondary | ICD-10-CM | POA: Diagnosis present

## 2018-12-03 DIAGNOSIS — K5790 Diverticulosis of intestine, part unspecified, without perforation or abscess without bleeding: Secondary | ICD-10-CM | POA: Diagnosis present

## 2018-12-03 DIAGNOSIS — N184 Chronic kidney disease, stage 4 (severe): Secondary | ICD-10-CM | POA: Diagnosis present

## 2018-12-03 DIAGNOSIS — R404 Transient alteration of awareness: Secondary | ICD-10-CM | POA: Diagnosis not present

## 2018-12-03 DIAGNOSIS — N200 Calculus of kidney: Secondary | ICD-10-CM | POA: Diagnosis not present

## 2018-12-03 DIAGNOSIS — E875 Hyperkalemia: Secondary | ICD-10-CM | POA: Diagnosis present

## 2018-12-03 DIAGNOSIS — E11649 Type 2 diabetes mellitus with hypoglycemia without coma: Secondary | ICD-10-CM | POA: Diagnosis not present

## 2018-12-03 DIAGNOSIS — E8770 Fluid overload, unspecified: Secondary | ICD-10-CM | POA: Diagnosis not present

## 2018-12-03 DIAGNOSIS — N179 Acute kidney failure, unspecified: Secondary | ICD-10-CM | POA: Diagnosis not present

## 2018-12-03 DIAGNOSIS — L8990 Pressure ulcer of unspecified site, unspecified stage: Secondary | ICD-10-CM

## 2018-12-03 DIAGNOSIS — Z7189 Other specified counseling: Secondary | ICD-10-CM

## 2018-12-03 DIAGNOSIS — K82A2 Perforation of gallbladder in cholecystitis: Secondary | ICD-10-CM | POA: Diagnosis present

## 2018-12-03 DIAGNOSIS — Z66 Do not resuscitate: Secondary | ICD-10-CM | POA: Diagnosis present

## 2018-12-03 DIAGNOSIS — Z8673 Personal history of transient ischemic attack (TIA), and cerebral infarction without residual deficits: Secondary | ICD-10-CM | POA: Diagnosis not present

## 2018-12-03 DIAGNOSIS — R197 Diarrhea, unspecified: Secondary | ICD-10-CM

## 2018-12-03 DIAGNOSIS — I482 Chronic atrial fibrillation, unspecified: Secondary | ICD-10-CM | POA: Diagnosis present

## 2018-12-03 DIAGNOSIS — R531 Weakness: Secondary | ICD-10-CM | POA: Diagnosis not present

## 2018-12-03 DIAGNOSIS — I129 Hypertensive chronic kidney disease with stage 1 through stage 4 chronic kidney disease, or unspecified chronic kidney disease: Secondary | ICD-10-CM | POA: Diagnosis present

## 2018-12-03 DIAGNOSIS — I2510 Atherosclerotic heart disease of native coronary artery without angina pectoris: Secondary | ICD-10-CM | POA: Diagnosis present

## 2018-12-03 DIAGNOSIS — I252 Old myocardial infarction: Secondary | ICD-10-CM

## 2018-12-03 DIAGNOSIS — Z87440 Personal history of urinary (tract) infections: Secondary | ICD-10-CM

## 2018-12-03 DIAGNOSIS — R4182 Altered mental status, unspecified: Secondary | ICD-10-CM | POA: Diagnosis not present

## 2018-12-03 DIAGNOSIS — Z79899 Other long term (current) drug therapy: Secondary | ICD-10-CM

## 2018-12-03 DIAGNOSIS — Z7901 Long term (current) use of anticoagulants: Secondary | ICD-10-CM | POA: Diagnosis not present

## 2018-12-03 DIAGNOSIS — E1122 Type 2 diabetes mellitus with diabetic chronic kidney disease: Secondary | ICD-10-CM | POA: Diagnosis present

## 2018-12-03 DIAGNOSIS — Z23 Encounter for immunization: Secondary | ICD-10-CM

## 2018-12-03 DIAGNOSIS — R059 Cough, unspecified: Secondary | ICD-10-CM

## 2018-12-03 DIAGNOSIS — Z881 Allergy status to other antibiotic agents status: Secondary | ICD-10-CM

## 2018-12-03 DIAGNOSIS — Z7983 Long term (current) use of bisphosphonates: Secondary | ICD-10-CM

## 2018-12-03 DIAGNOSIS — Z96641 Presence of right artificial hip joint: Secondary | ICD-10-CM | POA: Diagnosis present

## 2018-12-03 DIAGNOSIS — R0902 Hypoxemia: Secondary | ICD-10-CM | POA: Diagnosis not present

## 2018-12-03 DIAGNOSIS — E279 Disorder of adrenal gland, unspecified: Secondary | ICD-10-CM | POA: Diagnosis present

## 2018-12-03 DIAGNOSIS — Z791 Long term (current) use of non-steroidal anti-inflammatories (NSAID): Secondary | ICD-10-CM | POA: Diagnosis not present

## 2018-12-03 DIAGNOSIS — J984 Other disorders of lung: Secondary | ICD-10-CM | POA: Diagnosis not present

## 2018-12-03 DIAGNOSIS — Z833 Family history of diabetes mellitus: Secondary | ICD-10-CM

## 2018-12-03 DIAGNOSIS — G301 Alzheimer's disease with late onset: Secondary | ICD-10-CM | POA: Diagnosis present

## 2018-12-03 LAB — COMPREHENSIVE METABOLIC PANEL
ALT: 13 U/L (ref 0–44)
AST: 25 U/L (ref 15–41)
Albumin: 3.7 g/dL (ref 3.5–5.0)
Alkaline Phosphatase: 81 U/L (ref 38–126)
Anion gap: 10 (ref 5–15)
BUN: 68 mg/dL — ABNORMAL HIGH (ref 8–23)
CHLORIDE: 100 mmol/L (ref 98–111)
CO2: 22 mmol/L (ref 22–32)
CREATININE: 2.78 mg/dL — AB (ref 0.44–1.00)
Calcium: 8.4 mg/dL — ABNORMAL LOW (ref 8.9–10.3)
GFR calc Af Amer: 17 mL/min — ABNORMAL LOW (ref >60)
GFR calc non Af Amer: 14 mL/min — ABNORMAL LOW (ref >60)
Glucose, Bld: 238 mg/dL — ABNORMAL HIGH (ref 70–99)
Potassium: 5.4 mmol/L — ABNORMAL HIGH (ref 3.5–5.1)
Sodium: 132 mmol/L — ABNORMAL LOW (ref 135–145)
Total Bilirubin: 1.1 mg/dL (ref 0.3–1.2)
Total Protein: 7.4 g/dL (ref 6.5–8.1)

## 2018-12-03 LAB — URINALYSIS, ROUTINE W REFLEX MICROSCOPIC
BACTERIA UA: NONE SEEN
Bilirubin Urine: NEGATIVE
Glucose, UA: NEGATIVE mg/dL
Ketones, ur: NEGATIVE mg/dL
NITRITE: NEGATIVE
Protein, ur: 100 mg/dL — AB
Specific Gravity, Urine: 1.011 (ref 1.005–1.030)
Squamous Epithelial / HPF: NONE SEEN (ref 0–5)
WBC, UA: >50 WBC/hpf — ABNORMAL HIGH (ref 0–5)
pH: 5 (ref 5.0–8.0)

## 2018-12-03 LAB — CBC WITH DIFFERENTIAL/PLATELET
Abs Immature Granulocytes: 0.28 10*3/uL — ABNORMAL HIGH (ref 0.00–0.07)
Basophils Absolute: 0.1 10*3/uL (ref 0.0–0.1)
Basophils Relative: 0 %
EOS PCT: 1 %
Eosinophils Absolute: 0.1 10*3/uL (ref 0.0–0.5)
HCT: 33.6 % — ABNORMAL LOW (ref 36.0–46.0)
Hemoglobin: 10.8 g/dL — ABNORMAL LOW (ref 12.0–15.0)
IMMATURE GRANULOCYTES: 1 %
LYMPHS ABS: 0.6 10*3/uL — AB (ref 0.7–4.0)
LYMPHS PCT: 3 %
MCH: 30.9 pg (ref 26.0–34.0)
MCHC: 32.1 g/dL (ref 30.0–36.0)
MCV: 96 fL (ref 80.0–100.0)
Monocytes Absolute: 1.9 10*3/uL — ABNORMAL HIGH (ref 0.1–1.0)
Monocytes Relative: 9 %
Neutro Abs: 18.8 10*3/uL — ABNORMAL HIGH (ref 1.7–7.7)
Neutrophils Relative %: 86 %
Platelets: 253 10*3/uL (ref 150–400)
RBC: 3.5 MIL/uL — ABNORMAL LOW (ref 3.87–5.11)
RDW: 12.4 % (ref 11.5–15.5)
WBC: 21.8 10*3/uL — ABNORMAL HIGH (ref 4.0–10.5)
nRBC: 0 % (ref 0.0–0.2)

## 2018-12-03 LAB — CG4 I-STAT (LACTIC ACID): LACTIC ACID, VENOUS: 1.58 mmol/L (ref 0.5–1.9)

## 2018-12-03 MED ORDER — INSULIN ASPART 100 UNIT/ML ~~LOC~~ SOLN
10.0000 [IU] | Freq: Once | SUBCUTANEOUS | Status: AC
  Administered 2018-12-03: 10 [IU] via INTRAVENOUS
  Filled 2018-12-03: qty 1

## 2018-12-03 MED ORDER — CALCIUM GLUCONATE-NACL 1-0.675 GM/50ML-% IV SOLN
1.0000 g | Freq: Once | INTRAVENOUS | Status: AC
  Administered 2018-12-03: 1000 mg via INTRAVENOUS
  Filled 2018-12-03: qty 50

## 2018-12-03 MED ORDER — SODIUM BICARBONATE 8.4 % IV SOLN
50.0000 meq | Freq: Once | INTRAVENOUS | Status: AC
  Administered 2018-12-03: 50 meq via INTRAVENOUS
  Filled 2018-12-03: qty 50

## 2018-12-03 MED ORDER — ACETAMINOPHEN 325 MG PO TABS: 650.0000 mg | ORAL_TABLET | Freq: Four times a day (QID) | ORAL | Status: DC | PRN

## 2018-12-03 MED ORDER — PIPERACILLIN-TAZOBACTAM 3.375 G IVPB
3.3750 g | Freq: Two times a day (BID) | INTRAVENOUS | Status: DC
  Administered 2018-12-03 – 2018-12-08 (×10): 3.375 g via INTRAVENOUS
  Filled 2018-12-03 (×10): qty 50

## 2018-12-03 MED ORDER — ONDANSETRON HCL 4 MG/2ML IJ SOLN: 4.0000 mg | Freq: Four times a day (QID) | INTRAMUSCULAR | Status: DC | PRN

## 2018-12-03 MED ORDER — DEXTROSE 50 % IV SOLN
1.0000 | Freq: Once | INTRAVENOUS | Status: AC
  Administered 2018-12-03: 50 mL via INTRAVENOUS
  Filled 2018-12-03: qty 50

## 2018-12-03 MED ORDER — BISACODYL 5 MG PO TBEC
5.0000 mg | DELAYED_RELEASE_TABLET | Freq: Every day | ORAL | Status: DC | PRN
  Filled 2018-12-03: qty 1

## 2018-12-03 MED ORDER — ONDANSETRON HCL 4 MG PO TABS
4.0000 mg | ORAL_TABLET | Freq: Four times a day (QID) | ORAL | Status: DC | PRN
  Administered 2018-12-05: 18:00:00 4 mg via ORAL
  Filled 2018-12-03: qty 1

## 2018-12-03 MED ORDER — DOCUSATE SODIUM 100 MG PO CAPS
100.0000 mg | ORAL_CAPSULE | Freq: Two times a day (BID) | ORAL | Status: DC
  Administered 2018-12-04: 08:00:00 100 mg via ORAL
  Filled 2018-12-03: qty 1

## 2018-12-03 MED ORDER — PIPERACILLIN-TAZOBACTAM 3.375 G IVPB 30 MIN
3.3750 g | Freq: Once | INTRAVENOUS | Status: AC
  Administered 2018-12-03: 3.375 g via INTRAVENOUS
  Filled 2018-12-03: qty 50

## 2018-12-03 MED ORDER — SODIUM CHLORIDE 0.9 % IV SOLN
INTRAVENOUS | Status: DC
  Administered 2018-12-03 – 2018-12-06 (×5): via INTRAVENOUS

## 2018-12-03 MED ORDER — ACETAMINOPHEN 650 MG RE SUPP: 650.0000 mg | Freq: Four times a day (QID) | RECTAL | Status: DC | PRN

## 2018-12-03 MED ORDER — SODIUM CHLORIDE 0.9 % IV BOLUS
1000.0000 mL | Freq: Once | INTRAVENOUS | Status: AC
  Administered 2018-12-03: 1000 mL via INTRAVENOUS

## 2018-12-03 MED ORDER — SODIUM CHLORIDE 0.9 % IV SOLN: 1.0000 g | Freq: Once | INTRAVENOUS | Status: DC

## 2018-12-03 MED ORDER — ACETAMINOPHEN 500 MG PO TABS
1000.0000 mg | ORAL_TABLET | Freq: Once | ORAL | Status: AC
  Administered 2018-12-03: 1000 mg via ORAL
  Filled 2018-12-03: qty 2

## 2018-12-03 MED ORDER — HEPARIN SODIUM (PORCINE) 5000 UNIT/ML IJ SOLN
5000.0000 [IU] | Freq: Three times a day (TID) | INTRAMUSCULAR | Status: DC
  Administered 2018-12-03 – 2018-12-08 (×13): 5000 [IU] via SUBCUTANEOUS
  Filled 2018-12-03 (×16): qty 1

## 2018-12-03 NOTE — ED Notes (Signed)
ED TO INPATIENT HANDOFF REPORT  Name/Age/Gender Robin Morales 84 y.o. female  Code Status    Code Status Orders  (From admission, onward)         Start     Ordered   12/03/18 1836  Do not attempt resuscitation (DNR)  Continuous    Question Answer Comment  In the event of cardiac or respiratory ARREST Do not call a "code blue"   In the event of cardiac or respiratory ARREST Do not perform Intubation, CPR, defibrillation or ACLS   In the event of cardiac or respiratory ARREST Use medication by any route, position, wound care, and other measures to relive pain and suffering. May use oxygen, suction and manual treatment of airway obstruction as needed for comfort.   Comments NMP      12/03/18 1838        Code Status History    Date Active Date Inactive Code Status Order ID Comments User Context   03/28/2016 0746 03/29/2016 1536 Full Code [id number]  Saundra Shelling, MD Inpatient      Home/SNF/Other   Chief Complaint weakness  Level of Care/Admitting Diagnosis ED Disposition    ED Disposition Condition [place]: [place] [[id number]]  Level of Care: Med-Surg [16]  Diagnosis: Sepsis [facility]) [[id number]]  Admitting Physician: Epifanio Lesches [[id number]]  Attending Physician: Epifanio Lesches [phone]  Estimated length of stay: past midnight tomorrow  Certification:: I certify this patient will need inpatient services for at least 2 midnights  PT Class (Do Not Modify): Inpatient [101]  PT Acc Code (Do Not Modify): Private [1]       Medical History Past Medical History:  Diagnosis Date  . A-fib ([place]) 03/28/2016  . Arthritis   . CAD (coronary artery disease) 07/10/2014  . Chronic ischemic left PCA stroke 04/14/2016   Overview:  Left PCA  . CVA (cerebral vascular accident) ([place])   . Diabetes mellitus without complication ([place])   . Gastroesophageal reflux disease without esophagitis 07/05/2015  . Hypercholesterolemia   .  Hyperlipidemia 07/10/2014  . Hypertension   . MI (myocardial infarction) ([place])   . TIA (transient ischemic attack) 03/28/2016  . Type 2 diabetes mellitus with diabetic nephropathy ([place]) 01/14/2016    Allergies Allergies  Allergen Reactions  . Gabapentin     Other reaction(s): Dizziness  . Keflex [Cephalexin] Itching    IV Location/Drains/Wounds Patient Lines/Drains/Airways Status   Active Line/Drains/Airways    Name:   Placement date:   Placement time:   Site:   Days:   Peripheral IV 12/03/18 Right Forearm   12/03/18    1359    Forearm   less than 1   Peripheral IV 12/03/18 Left Wrist   12/03/18    1429    Wrist   less than 1          Labs/Imaging Results for orders placed or performed during the hospital encounter of 12/03/18 (from the past 48 hour(s))  CG4 I-STAT (Lactic acid)     Status: None   Collection Time: 12/03/18  2:02 PM  Result Value Ref Range   Lactic Acid, Venous 1.58 0.5 - 1.9 mmol/L  Comprehensive metabolic panel     Status: Abnormal   Collection Time: 12/03/18  2:05 PM  Result Value Ref Range   Sodium 132 (L) 135 - 145 mmol/L   Potassium 5.4 (H) 3.5 - 5.1 mmol/L   Chloride 100 98 - 111 mmol/L   CO2 22 22 -  32 mmol/L   Glucose, Bld 238 (H) 70 - 99 mg/dL   BUN 68 (H) 8 - 23 mg/dL   Creatinine, Ser 2.78 (H) 0.44 - 1.00 mg/dL   Calcium 8.4 (L) 8.9 - 10.3 mg/dL   Total Protein 7.4 6.5 - 8.1 g/dL   Albumin 3.7 3.5 - 5.0 g/dL   AST 25 15 - 41 U/L   ALT 13 0 - 44 U/L   Alkaline Phosphatase 81 38 - 126 U/L   Total Bilirubin 1.1 0.3 - 1.2 mg/dL   GFR calc non Af Amer 14 (L) >60 mL/min   GFR calc Af Amer 17 (L) >60 mL/min   Anion gap 10 5 - 15    Comment: Performed at [facility], [address] [place], [place] [address]  CBC WITH DIFFERENTIAL     Status: Abnormal   Collection Time: 12/03/18  2:05 PM  Result Value Ref Range   WBC 21.8 (H) 4.0 - 10.5 K/uL    Comment: WHITE COUNT CONFIRMED ON SMEAR   RBC 3.50 (L) 3.87 - 5.11 MIL/uL   Hemoglobin  10.8 (L) 12.0 - 15.0 g/dL   HCT 33.6 (L) 36.0 - 46.0 %   MCV 96.0 80.0 - 100.0 fL   MCH 30.9 26.0 - 34.0 pg   MCHC 32.1 30.0 - 36.0 g/dL   RDW 12.4 11.5 - 15.5 %   Platelets 253 150 - 400 K/uL   nRBC 0.0 0.0 - 0.2 %   Neutrophils Relative % 86 %   Neutro Abs 18.8 (H) 1.7 - 7.7 K/uL   Lymphocytes Relative 3 %   Lymphs Abs 0.6 (L) 0.7 - 4.0 K/uL   Monocytes Relative 9 %   Monocytes Absolute 1.9 (H) 0.1 - 1.0 K/uL   Eosinophils Relative 1 %   Eosinophils Absolute 0.1 0.0 - 0.5 K/uL   Basophils Relative 0 %   Basophils Absolute 0.1 0.0 - 0.1 K/uL   WBC Morphology TOXIC GRANULATION    RBC Morphology MORPHOLOGY UNREMARKABLE    Smear Review MORPHOLOGY UNREMARKABLE    Immature Granulocytes 1 %   Abs Immature Granulocytes 0.28 (H) 0.00 - 0.07 K/uL    Comment: Performed at [facility], [address] [place], [place] [address]  Urinalysis, Routine w reflex microscopic     Status: Abnormal   Collection Time: 12/03/18  3:48 PM  Result Value Ref Range   Color, Urine YELLOW (A) YELLOW   APPearance TURBID (A) CLEAR   Specific Gravity, Urine 1.011 1.005 - 1.030   pH 5.0 5.0 - 8.0   Glucose, UA NEGATIVE NEGATIVE mg/dL   Hgb urine dipstick MODERATE (A) NEGATIVE   Bilirubin Urine NEGATIVE NEGATIVE   Ketones, ur NEGATIVE NEGATIVE mg/dL   Protein, ur 100 (A) NEGATIVE mg/dL   Nitrite NEGATIVE NEGATIVE   Leukocytes, UA LARGE (A) NEGATIVE   RBC / HPF 21-50 0 - 5 RBC/hpf   WBC, UA >50 (H) 0 - 5 WBC/hpf   Bacteria, UA NONE SEEN NONE SEEN   Squamous Epithelial / LPF NONE SEEN 0 - 5   WBC Clumps PRESENT     Comment: Performed at [facility], [address] [place], [place] [address]   Ct Abdomen Pelvis Wo Contrast  Result Date: 12/03/2018 CLINICAL DATA:  Weakness and abdominal pain EXAM: CT ABDOMEN AND PELVIS WITHOUT CONTRAST TECHNIQUE: Multidetector CT imaging of the abdomen and pelvis was performed following the standard protocol without IV contrast. COMPARISON:   03/11/2017 FINDINGS: Lower chest: No  acute abnormality. Hepatobiliary: The liver demonstrates evidence of pneumobilia. The gallbladder is well distended and demonstrates what appears to be a large gallstone within. Additionally there is air within the wall of the gallbladder and small foci of air surrounding the gallbladder consistent with emphysematous cholecystitis and likely contained perforation. Air is also noted within the common bile duct. This would correspond with the pneumobilia seen within the liver. Pancreas: Fatty infiltration of the pancreas is noted. No focal mass is noted. Spleen: Normal in size without focal abnormality. Adrenals/Urinary Tract: Stable left adrenal lesion is noted measuring 2.1 cm. Right adrenal gland is unremarkable. The left kidney demonstrates no evidence of renal calculi or urinary tract obstructive changes. The right kidney is somewhat malrotated and demonstrates stones within the lower pole measuring 12 mm in greatest dimension. This has increased slightly in the interval from the prior exam at which time it measured 1 cm. Some small calcifications are noted along the margin of the right kidney which are stable in appearance from the prior exam. Bladder is partially distended. Stomach/Bowel: Prominent fecal material is noted within the rectal vault which may represent a mild degree of rectal impaction. Diverticular changes noted without definitive diverticulitis. Some inflammatory changes are noted in the region of the hepatic flexure likely related to the associated gallbladder changes. The appendix is well seen and within normal limits. Small bowel shows no obstructive changes. A fatty lesion is noted within the second portion of the duodenum consistent with small lipoma. This is stable from the previous exam. Vascular/Lymphatic: Diffuse aortic calcifications are noted. Mild ectasia is seen to 2.8 cm. No significant lymphadenopathy is identified. Reproductive: Uterus and  bilateral adnexa are unremarkable. Other: No abdominal wall hernia or abnormality. No abdominopelvic ascites. Musculoskeletal: Right hip replacement is noted. Degenerative changes of the lumbar spine are seen. No compression deformities are noted. IMPRESSION: Changes consistent with emphysematous cholecystitis with what appears to be a gallstone within the gallbladder. Associated pneumobilia is seen as well as some air adjacent to the gallbladder suggestive of contained rupture. Changes of diverticulosis. No definitive diverticulitis is seen although some inflammatory changes in the hepatic flexure likely related to the gallbladder changes are noted. Stable left adrenal lesion. Lower pole right renal calculus with mild increase in size of 2 mm when compared with the prior exam. Mild ectasia of the abdominal aorta. Critical Value/emergent results were called by telephone at the time of interpretation on 12/03/2018 at 4:10 pm to Dr. Rudene Re , who verbally acknowledged these results. Electronically Signed   By: Inez Catalina M.D.   On: 12/03/2018 16:15   Dg Chest 2 View  Result Date: 12/03/2018 CLINICAL DATA:  Weakness EXAM: CHEST - 2 VIEW COMPARISON:  01/28/2017 FINDINGS: Cardiac shadow is mildly enlarged but stable. Aortic calcifications are seen. The lungs are well aerated bilaterally without focal infiltrate or sizable effusion. No acute bony abnormality is noted. IMPRESSION: No acute abnormality seen. Electronically Signed   By: Inez Catalina M.D.   On: 12/03/2018 14:58    Pending Labs Unresulted Labs (From admission, onward)    Start     Ordered   12/04/18 2683  Basic metabolic panel  Tomorrow morning,   STAT     12/03/18 1838   12/04/18 0500  CBC  Tomorrow morning,   STAT     12/03/18 1838   12/03/18 1835  CBC  (heparin)  Once,   STAT    Comments:  Baseline for heparin therapy IF NOT ALREADY DRAWN.  Notify MD if PLT < 100 K.    12/03/18 1838   12/03/18 1835  Creatinine, serum   (heparin)  Once,   STAT    Comments:  Baseline for heparin therapy IF NOT ALREADY DRAWN.    12/03/18 1838   12/03/18 1352  Gastrointestinal Panel by PCR , Stool  (Gastrointestinal Panel by PCR, Stool)  Once,   STAT     12/03/18 1352   12/03/18 1352  C difficile quick scan w PCR reflex  (C Difficile quick screen w PCR reflex panel)  Once, for 24 hours,   STAT     12/03/18 1352   12/03/18 1351  Blood Culture (routine x 2)  BLOOD CULTURE X 2,   STAT     12/03/18 1351          Vitals/Pain Today's Vitals   12/03/18 1815 12/03/18 1830 12/03/18 1900 12/03/18 1915  BP:  (!) 122/51 (!) 142/55   Pulse: 65 63 68 65  Resp: 12 12  14   Temp:      TempSrc:      SpO2: 97% 97% 100% 99%  Weight:      Height:      PainSc:        Isolation Precautions Enteric precautions (UV disinfection)  Medications Medications  heparin injection 5,000 Units (has no administration in time range)  0.9 %  sodium chloride infusion (has no administration in time range)  acetaminophen (TYLENOL) tablet 650 mg (has no administration in time range)    Or  acetaminophen (TYLENOL) suppository 650 mg (has no administration in time range)  docusate sodium (COLACE) capsule 100 mg (has no administration in time range)  bisacodyl (DULCOLAX) EC tablet 5 mg (has no administration in time range)  ondansetron (ZOFRAN) tablet 4 mg (has no administration in time range)    Or  ondansetron (ZOFRAN) injection 4 mg (has no administration in time range)  piperacillin-tazobactam (ZOSYN) IVPB 3.375 g (has no administration in time range)  acetaminophen (TYLENOL) tablet 1,000 mg (1,000 mg Oral Given 12/03/18 1527)  sodium chloride 0.9 % bolus 1,000 mL (0 mLs Intravenous Stopped 12/03/18 1753)  piperacillin-tazobactam (ZOSYN) IVPB 3.375 g (0 g Intravenous Stopped 12/03/18 1628)  sodium chloride 0.9 % bolus 1,000 mL (0 mLs Intravenous Stopped 12/03/18 1649)  sodium bicarbonate injection 50 mEq (50 mEq Intravenous Given 12/03/18  1617)  dextrose 50 % solution 50 mL (50 mLs Intravenous Given 12/03/18 1619)  insulin aspart (novoLOG) injection 10 Units (10 Units Intravenous Given 12/03/18 1618)  calcium gluconate 1 g/ 50 mL sodium chloride IVPB (0 g Intravenous Stopped 12/03/18 1628)    Mobility

## 2018-12-03 NOTE — ED Notes (Signed)
Patient transported to X-ray 

## 2018-12-03 NOTE — Consult Note (Signed)
Patient ID: Robin Morales, female   DOB: 1926/03/31, 84 y.o.   MRN: [id number]  HPI Robin Morales is a 84 y.o. female asked to see in consultation by Dr. Braulio Bosch.  I discussed with her in detail.  Briefly she is a 84 year old female with multiple comorbidities including coronary artery disease, A. fib, history of a stroke and dementia presents with Dr. thrive diarrhea and nausea. Patient is nonresponsive and I am unable to obtain a full H&P from the patient and the majority of the record was taken from conversations to include the nurse and the ER doctor.  Has been a drastic change of mentation and patient now is nonresponsive. The scan personally reviewed showing evidence of this emesis cholecystitis associated with pneumobilia.  Count is 21,000 hemoglobin is 10.8 and creatinine is 0.78 with a potassium of 5.4.   HPI  Past Medical History:  Diagnosis Date  . A-fib ([place]) 03/28/2016  . Arthritis   . CAD (coronary artery disease) 07/10/2014  . Chronic ischemic left PCA stroke 04/14/2016   Overview:  Left PCA  . CVA (cerebral vascular accident) ([place])   . Diabetes mellitus without complication ([place])   . Gastroesophageal reflux disease without esophagitis 07/05/2015  . Hypercholesterolemia   . Hyperlipidemia 07/10/2014  . Hypertension   . MI (myocardial infarction) ([place])   . TIA (transient ischemic attack) 03/28/2016  . Type 2 diabetes mellitus with diabetic nephropathy ([place]) 01/14/2016    Past Surgical History:  Procedure Laterality Date  . HIP ARTHROPLASTY      Family History  Problem Relation Age of Onset  . Hypertension Daughter   . Diabetes Sister   . Prostate cancer Neg Hx     Social History Social History   Tobacco Use  . Smoking status: Never Smoker  . Smokeless tobacco: Never Used  Substance Use Topics  . Alcohol use: No  . Drug use: No    Allergies  Allergen Reactions  . Gabapentin     Other reaction(s): Dizziness  . Keflex [Cephalexin] Itching    Current  Facility-Administered Medications  Medication Dose Route Frequency Provider Last Rate Last Dose  . 0.9 %  sodium chloride infusion   Intravenous Continuous Epifanio Lesches, MD      . acetaminophen (TYLENOL) tablet 650 mg  650 mg Oral Q6H PRN Epifanio Lesches, MD       Or  . acetaminophen (TYLENOL) suppository 650 mg  650 mg Rectal Q6H PRN Epifanio Lesches, MD      . bisacodyl (DULCOLAX) EC tablet 5 mg  5 mg Oral Daily PRN Epifanio Lesches, MD      . docusate sodium (COLACE) capsule 100 mg  100 mg Oral BID Epifanio Lesches, MD      . heparin injection 5,000 Units  5,000 Units Subcutaneous Q8H Epifanio Lesches, MD      . ondansetron (ZOFRAN) tablet 4 mg  4 mg Oral Q6H PRN Epifanio Lesches, MD       Or  . ondansetron (ZOFRAN) injection 4 mg  4 mg Intravenous Q6H PRN Epifanio Lesches, MD       Current Outpatient Medications  Medication Sig Dispense Refill  . acetaminophen (TYLENOL) 500 MG tablet Take 500 mg by mouth every 6 (six) hours as needed for mild pain.     Marland Kitchen alendronate (FOSAMAX) 70 MG tablet Take 70 mg by mouth every Thursday.     Marland Kitchen apixaban (ELIQUIS) 2.5 MG TABS tablet Take 1 tablet (2.5 mg total) by mouth 2 (two) times  daily. 60 tablet 0  . atorvastatin (LIPITOR) 40 MG tablet Take 1 tablet (40 mg total) by mouth daily at 6 PM. (Patient taking differently: Take 40 mg by mouth daily. ) 30 tablet 0  . Chlorpheniramine-Acetaminophen (CORICIDIN HBP COLD/FLU PO) Take 1 tablet by mouth daily as needed (cold and flu symptoms).     . cholecalciferol (VITAMIN D3) 25 MCG (1000 UT) tablet Take 1,000 Units by mouth daily.    . Diphenhydramine-PE-APAP (DELSYM COUGH/COLD NIGHT TIME PO) Take 1 Dose by mouth daily as needed (cough/cold symptoms).     Marland Kitchen glipiZIDE (GLUCOTROL) 5 MG tablet Take 5 mg by mouth daily before breakfast.     . hydrochlorothiazide (HYDRODIURIL) 25 MG tablet Take 25 mg by mouth daily.    . meloxicam (MOBIC) 15 MG tablet Take 15 mg by mouth daily.      . metoprolol (LOPRESSOR) 50 MG tablet Take 50 mg by mouth 2 (two) times daily.     Marland Kitchen omeprazole (PRILOSEC) 40 MG capsule Take 1 capsule by mouth daily.    . risperiDONE (RISPERDAL) 0.25 MG tablet Take 0.25 mg by mouth 2 (two) times daily.    . sodium bicarbonate 325 MG tablet Take 325 mg by mouth 4 (four) times daily.    . vitamin B-12 (CYANOCOBALAMIN) 1000 MCG tablet Take 1,000 mcg by mouth daily.       Review of Systems Full ROS  was tented but it was unable to be performed due to the patient being non responsive  Physical Exam Blood pressure (!) 112/45, pulse 70, temperature (!) 100.7 F (38.2 C), temperature source Oral, resp. rate 12, height 5\' 2"  (1.575 m), weight 70.3 kg, SpO2 95 %. CONSTITUTIONAL: she is debilitated and frail EYES: Pupils are equal, round, and reactive to light, Sclera are non-icteric. EARS, NOSE, MOUTH AND THROAT: The oropharynx is clear. The oral mucosa is pink and moist. Hearing is intact to voice. LYMPH NODES:  Lymph nodes in the neck are normal. RESPIRATORY:  Lungs are clear. There is normal respiratory effort, with equal breath sounds bilaterally, and without pathologic use of accessory muscles. CARDIOVASCULAR: Heart is regular without murmurs, gallops, or rubs. GI: The abdomen is  soft, nontender, and nondistended. There are no palpable masses. There is no hepatosplenomegaly. There are normal bowel sounds in all quadrants. GU: Rectal deferred.   MUSCULOSKELETAL: Normal muscle strength and tone. No cyanosis or edema.   SKIN: Turgor is good and there are no pathologic skin lesions or ulcers. NEUROLOGIC: He is laying in bed with her eyes closed.  Only response to sternal rub.  Open the eyes only to sternal rubs.  No evidence of focal neurological deficits.  Data Reviewed  I have personally reviewed the patient's imaging, laboratory findings and medical records.    Assessment/Plan 84 year old female with multiple comorbidities including A. fib, dementia,  debility presents with sepsis likely from acute cholecystitis with contained rupture, pneumobilia.  Patient in extremis, she  is currently not responsive and overall very poor prognosis .  Family does not want any aggressive intervention or any procedures to include cholecystostomy tube.  Think that this will be a good candidate for hospice care but I think the family is not quite ready yet.  With that in mind the hospitalist will admit and manage her medically.  Due to her extremis she is not a surgical candidate and the family is refusing any major surgical intervention.  We will be available if needed  Caroleen Hamman, MD [place] Surgeon  12/03/2018, 7:11 PM

## 2018-12-03 NOTE — ED Triage Notes (Signed)
Pt arrives via ems from home, lives with daughter. Ems states daughter reports pt seems weaker than normal and having trouble walking. Ems reports diarrhea x3 days. Nausea, no vomiting. Pt able to states name and birthday, unable to state age, or current month. No acute distress noted at this time.

## 2018-12-03 NOTE — ED Provider Notes (Addendum)
[facility] Emergency Department Provider Note  ____________________________________________  Time seen: Approximately 1:52 PM  I have reviewed the triage vital signs and the nursing notes.   HISTORY  Chief Complaint Fatigue; Nausea; and Diarrhea   HPI Robin Morales is a 84 y.o. female with a history of A. fib on Eliquis, CAD, CVA, diabetes, hypertension, hyperlipidemia who presents from home via EMS for generalized weakness.  According to EMS patient has had 2 days of diarrhea and today was extremely weak.  She usually walks with the assistance of a walker but today was unable to do so.  Patient has advanced dementia and is unable to provide any history at this time.  She denies headache, chest pain, abdominal pain, shortness of breath.  Patient was found to have a low-grade fever per EMS with normal pulse and normal blood pressure.  Past Medical History:  Diagnosis Date  . A-fib ([place]) 03/28/2016  . Arthritis   . CAD (coronary artery disease) 07/10/2014  . Chronic ischemic left PCA stroke 04/14/2016   Overview:  Left PCA  . CVA (cerebral vascular accident) ([place])   . Diabetes mellitus without complication ([place])   . Gastroesophageal reflux disease without esophagitis 07/05/2015  . Hypercholesterolemia   . Hyperlipidemia 07/10/2014  . Hypertension   . MI (myocardial infarction) ([place])   . TIA (transient ischemic attack) 03/28/2016  . Type 2 diabetes mellitus with diabetic nephropathy ([place]) 01/14/2016    Patient Active Problem List   Diagnosis Date Noted  . Age-related osteoporosis without current pathological fracture 04/04/2017  . Chronic ischemic left PCA stroke 04/14/2016  . A-fib ([place]) 03/28/2016  . TIA (transient ischemic attack) 03/28/2016  . Type 2 diabetes mellitus with diabetic nephropathy ([place]) 01/14/2016  . Type 2 diabetes mellitus with diabetic neuropathy ([place]) 01/14/2016  . Gastroesophageal reflux disease without esophagitis 07/05/2015  . CAD  (coronary artery disease) 07/10/2014  . Hyperlipidemia 07/10/2014  . Hypertension 07/10/2014    Past Surgical History:  Procedure Laterality Date  . HIP ARTHROPLASTY      Prior to Admission medications   Medication Sig Start Date End Date Taking? Authorizing Provider  acetaminophen (TYLENOL) 500 MG tablet Take 500 mg by mouth every 6 (six) hours as needed for mild pain.    Yes [provider]  alendronate (FOSAMAX) 70 MG tablet Take 70 mg by mouth every Thursday.  03/12/18  Yes [provider]  apixaban (ELIQUIS) 2.5 MG TABS tablet Take 1 tablet (2.5 mg total) by mouth 2 (two) times daily. 03/29/16  Yes Max Sane, MD  atorvastatin (LIPITOR) 40 MG tablet Take 1 tablet (40 mg total) by mouth daily at 6 PM. Patient taking differently: Take 40 mg by mouth daily.  03/29/16  Yes Max Sane, MD  Chlorpheniramine-Acetaminophen (CORICIDIN HBP COLD/FLU PO) Take 1 tablet by mouth daily as needed (cold and flu symptoms).    Yes [provider]  cholecalciferol (VITAMIN D3) 25 MCG (1000 UT) tablet Take 1,000 Units by mouth daily.   Yes [provider]  Diphenhydramine-PE-APAP (DELSYM COUGH/COLD NIGHT TIME PO) Take 1 Dose by mouth daily as needed (cough/cold symptoms).    Yes [provider]  glipiZIDE (GLUCOTROL) 5 MG tablet Take 5 mg by mouth daily before breakfast.  03/12/18  Yes [provider]  hydrochlorothiazide (HYDRODIURIL) 25 MG tablet Take 25 mg by mouth daily.   Yes [provider]  meloxicam (MOBIC) 15 MG tablet Take 15 mg by mouth daily.  02/27/18  Yes [provider]  metoprolol (LOPRESSOR) 50 MG tablet Take 50 mg by mouth 2 (two) times daily.    Yes [provider]  omeprazole (PRILOSEC) 40 MG capsule Take 1 capsule by mouth daily. 03/18/16  Yes [provider]  risperiDONE (RISPERDAL) 0.25 MG tablet Take 0.25 mg by mouth 2 (two) times daily.   Yes [provider]  sodium bicarbonate 325 MG tablet  Take 325 mg by mouth 4 (four) times daily.   Yes [provider]  vitamin B-12 (CYANOCOBALAMIN) 1000 MCG tablet Take 1,000 mcg by mouth daily.   Yes [provider]    Allergies Gabapentin and Keflex [cephalexin]  Family History  Problem Relation Age of Onset  . Hypertension Daughter   . Diabetes Sister   . Prostate cancer Neg Hx     Social History Social History   Tobacco Use  . Smoking status: Never Smoker  . Smokeless tobacco: Never Used  Substance Use Topics  . Alcohol use: No  . Drug use: No    Review of Systems  Constitutional: + fever and generalized weakness Cardiovascular: Negative for chest pain. Respiratory: Negative for shortness of breath. Gastrointestinal: Negative for abdominal pain, vomiting. + diarrhea. Skin: Negative for rash.  ____________________________________________   PHYSICAL EXAM:  VITAL SIGNS: Vitals:   12/03/18 1730 12/03/18 1743  BP: (!) 99/41 (!) 112/45  Pulse: 72 70  Resp: 13 12  Temp:    SpO2: 95% 95%   Constitutional: Awake, alert, oriented to self only. No distress HEENT:      Head: Normocephalic and atraumatic.         Eyes: Conjunctivae are normal. Sclera is non-icteric.       Mouth/Throat: Mucous membranes are moist.       Neck: Supple with no signs of meningismus. Cardiovascular: Regular rate and rhythm. No murmurs, gallops, or rubs. 2+ symmetrical distal pulses are present in all extremities. No JVD. Respiratory: Normal respiratory effort. Lungs are clear to auscultation bilaterally. Shallow breaths. No wheezes, crackles, or rhonchi.  Gastrointestinal: Soft, non tender, and non distended with positive bowel sounds. No rebound or guarding. Musculoskeletal:  No edema, cyanosis, or erythema of extremities. Neurologic: Normal speech and language. Face is symmetric. Normal strength on bilateral upper extremities, patient will not hold her legs up or try to elevate them herself however with noxious stimuli she  will bend her legs and withdrawal with intact to strength, downgoing toes bilaterally. Skin: Skin is warm, dry and intact. No rash noted. Psychiatric: Mood and affect are normal. Speech and behavior are normal.  ____________________________________________   LABS (all labs ordered are listed, but only abnormal results are displayed)  Labs Reviewed  COMPREHENSIVE METABOLIC PANEL - Abnormal; Notable for the following components:      Result Value   Sodium 132 (*)    Potassium 5.4 (*)    Glucose, Bld 238 (*)    BUN 68 (*)    Creatinine, Ser 2.78 (*)    Calcium 8.4 (*)    GFR calc non Af Amer 14 (*)    GFR calc Af Amer 17 (*)    All other components within normal limits  CBC WITH DIFFERENTIAL/PLATELET - Abnormal; Notable for the following components:   WBC 21.8 (*)    RBC 3.50 (*)    Hemoglobin 10.8 (*)    HCT 33.6 (*)    Neutro Abs 18.8 (*)    Lymphs Abs 0.6 (*)    Monocytes Absolute 1.9 (*)    Abs  Immature Granulocytes 0.28 (*)    All other components within normal limits  URINALYSIS, ROUTINE W REFLEX MICROSCOPIC - Abnormal; Notable for the following components:   Color, Urine YELLOW (*)    APPearance TURBID (*)    Hgb urine dipstick MODERATE (*)    Protein, ur 100 (*)    Leukocytes, UA LARGE (*)    WBC, UA >50 (*)    All other components within normal limits  CULTURE, BLOOD (ROUTINE X 2)  CULTURE, BLOOD (ROUTINE X 2)  GASTROINTESTINAL PANEL BY PCR, STOOL (REPLACES STOOL CULTURE)  C DIFFICILE QUICK SCREEN W PCR REFLEX  I-STAT CG4 LACTIC ACID, ED  CG4 I-STAT (LACTIC ACID)  I-STAT CG4 LACTIC ACID, ED   ____________________________________________  EKG  ED ECG REPORT I, Rudene Re, the attending physician, personally viewed and interpreted this ECG.  Sinus rhythm with first-degree AV block, normal QRS, normal QTC, normal axis, no ST elevations or depressions. Prior EKG showed afib  ____________________________________________  RADIOLOGY  I have  personally reviewed the images performed during this visit and I agree with the Radiologist's read.   Interpretation by Radiologist:  Ct Abdomen Pelvis Wo Contrast  Result Date: 12/03/2018 CLINICAL DATA:  Weakness and abdominal pain EXAM: CT ABDOMEN AND PELVIS WITHOUT CONTRAST TECHNIQUE: Multidetector CT imaging of the abdomen and pelvis was performed following the standard protocol without IV contrast. COMPARISON:  03/11/2017 FINDINGS: Lower chest: No acute abnormality. Hepatobiliary: The liver demonstrates evidence of pneumobilia. The gallbladder is well distended and demonstrates what appears to be a large gallstone within. Additionally there is air within the wall of the gallbladder and small foci of air surrounding the gallbladder consistent with emphysematous cholecystitis and likely contained perforation. Air is also noted within the common bile duct. This would correspond with the pneumobilia seen within the liver. Pancreas: Fatty infiltration of the pancreas is noted. No focal mass is noted. Spleen: Normal in size without focal abnormality. Adrenals/Urinary Tract: Stable left adrenal lesion is noted measuring 2.1 cm. Right adrenal gland is unremarkable. The left kidney demonstrates no evidence of renal calculi or urinary tract obstructive changes. The right kidney is somewhat malrotated and demonstrates stones within the lower pole measuring 12 mm in greatest dimension. This has increased slightly in the interval from the prior exam at which time it measured 1 cm. Some small calcifications are noted along the margin of the right kidney which are stable in appearance from the prior exam. Bladder is partially distended. Stomach/Bowel: Prominent fecal material is noted within the rectal vault which may represent a mild degree of rectal impaction. Diverticular changes noted without definitive diverticulitis. Some inflammatory changes are noted in the region of the hepatic flexure likely related to the  associated gallbladder changes. The appendix is well seen and within normal limits. Small bowel shows no obstructive changes. A fatty lesion is noted within the second portion of the duodenum consistent with small lipoma. This is stable from the previous exam. Vascular/Lymphatic: Diffuse aortic calcifications are noted. Mild ectasia is seen to 2.8 cm. No significant lymphadenopathy is identified. Reproductive: Uterus and bilateral adnexa are unremarkable. Other: No abdominal wall hernia or abnormality. No abdominopelvic ascites. Musculoskeletal: Right hip replacement is noted. Degenerative changes of the lumbar spine are seen. No compression deformities are noted. IMPRESSION: Changes consistent with emphysematous cholecystitis with what appears to be a gallstone within the gallbladder. Associated pneumobilia is seen as well as some air adjacent to the gallbladder suggestive of contained rupture. Changes of diverticulosis. No definitive diverticulitis is  seen although some inflammatory changes in the hepatic flexure likely related to the gallbladder changes are noted. Stable left adrenal lesion. Lower pole right renal calculus with mild increase in size of 2 mm when compared with the prior exam. Mild ectasia of the abdominal aorta. Critical Value/emergent results were called by telephone at the time of interpretation on 12/03/2018 at 4:10 pm to Dr. Rudene Re , who verbally acknowledged these results. Electronically Signed   By: Inez Catalina M.D.   On: 12/03/2018 16:15   Dg Chest 2 View  Result Date: 12/03/2018 CLINICAL DATA:  Weakness EXAM: CHEST - 2 VIEW COMPARISON:  01/28/2017 FINDINGS: Cardiac shadow is mildly enlarged but stable. Aortic calcifications are seen. The lungs are well aerated bilaterally without focal infiltrate or sizable effusion. No acute bony abnormality is noted. IMPRESSION: No acute abnormality seen. Electronically Signed   By: Inez Catalina M.D.   On: 12/03/2018 14:58     ____________________________________________   PROCEDURES  Procedure(s) performed: None Procedures Critical Care performed:  Yes  CRITICAL CARE Performed by: Rudene Re  ?  Total critical care time: 30 min  Critical care time was exclusive of separately billable procedures and treating other patients.  Critical care was necessary to treat or prevent imminent or life-threatening deterioration.  Critical care was time spent personally by me on the following activities: development of treatment plan with patient and/or surrogate as well as nursing, discussions with consultants, evaluation of patient's response to treatment, examination of patient, obtaining history from patient or surrogate, ordering and performing treatments and interventions, ordering and review of laboratory studies, ordering and review of radiographic studies, pulse oximetry and re-evaluation of patient's condition.  ____________________________________________   INITIAL IMPRESSION / ASSESSMENT AND PLAN / ED COURSE  84 y.o. female with a history of A. fib on Eliquis, CAD, CVA, diabetes, hypertension, hyperlipidemia who presents from home via EMS for generalized weakness in the setting of 2 days of diarrhea and a fever today.  Patient arrives with a temp of 100.79F, no tachycardia, tachypnea, hypoxia, and normotensive.  Exam is nonfocal with a shallow respirations but clear lungs, soft abdomen, no obvious neurological deficits.  Differential diagnoses including sepsis from C. difficile versus viral gastroenteritis versus bacterial gastroenteritis versus UTI versus pneumonia.  Will start patient on Tylenol and fluids, will check labs, chest x-ray and urinalysis.  Clinical Course as of Dec 03 1828  Tue Dec 03, 2018  1805 CT concerning for emphysematous cholecystitis.  Discussed with Dr. Dahlia Byes who said patient is not a good surgical candidate based on her age, history of dementia, and being on blood thinners.   I discussed the options with the family which would be surgery with high risk of mortality versus percutaneous drainage of the gallbladder with IV antibiotics versus IV antibiotics only versus comfort care.  All 3 daughters decided to do IV antibiotics and pain control but no invasive procedures.  Patient was given Zosyn.  I discussed with the hospitalist for admission.   [CV]    Clinical Course User Index [CV] Alfred Levins [place], MD   _________________________ 3:48 PM on 12/03/2018 -----------------------------------------  Labs concerning for sepsis with a white count of 21.8.  Patient's daughter is at the bedside and says the patient has been having profuse diarrhea for 2 days.  The diarrhea is light brown in color, no melena.  She was complaining of abdominal pain 2 days ago but has not had any more pain.  Since patient has very advanced dementia and is unable  to provide any history I will perform a CT abdomen pelvis to rule out any possible alternative etiology for her symptoms.  Stool and urine samples are pending.  Patient was started on sepsis protocol with Zosyn for possible intra-abdominal coverage.  Labs also concerning for acute on chronic kidney injury.  IV fluids are being administered.  Anticipate admission to the hospitalist.   As part of my medical decision making, I reviewed the following data within the Reader notes reviewed and incorporated, Labs reviewed \, EKG interpreted , Old chart reviewed, Radiograph reviewed , Discussed with admitting physician , A consult was requested and obtained from this/these consultant(s) Surgery, Notes from prior ED visits and [place] Controlled Substance Database    Pertinent labs & imaging results that were available during my care of the patient were reviewed by me and considered in my medical decision making (see chart for details).    ____________________________________________   FINAL CLINICAL IMPRESSION(S) / ED  DIAGNOSES  Final diagnoses:  Sepsis with acute renal failure without septic shock, due to unspecified organism, unspecified acute renal failure type ([place])  Diarrhea of presumed infectious origin  Acute kidney injury superimposed on chronic kidney disease (HCC)  Hyperkalemia  Acute emphysematous cholecystitis      NEW MEDICATIONS STARTED DURING THIS VISIT:  ED Discharge Orders    None       Note:  This document was prepared using Dragon voice recognition software and may include unintentional dictation errors.    Rudene Re, MD 12/03/18 Azzie Almas, [place], MD 12/12/18 [place], [place], MD 12/12/18 [phone]

## 2018-12-03 NOTE — H&P (Signed)
[facility] [place] NAME: Robin Morales    MR#:  [id number]  DATE OF BIRTH:  24-Aug-1926  DATE OF ADMISSION:  12/03/2018  PRIMARY CARE PHYSICIAN: Baxter Hire, MD   REQUESTING/REFERRING PHYSICIAN: Dr. Kelli Hope CHIEF COMPLAINT: Nausea, diarrhea, generalized weakness   Chief Complaint  Patient presents with  . Fatigue  . Nausea  . Diarrhea    HISTORY OF PRESENT ILLNESS:  Robin Morales  is a 84 y.o. female with a known history of diabetes mellitus type 2, essential hypertension, hyperlipidemia, chronic A. fib brought in by family because of 2-day history of diarrhea, generalized weakness, poor p.o. intake.  Patient usually walks with assistance with walker today family noticed that she is extremely weak and they brought her here because of that.  Patient is unresponsive during my visit and unable to provide any history.  According to my discussion with physician who spoke to family members, patient has emphysematous cholecystitis, sepsis, acute renal failure, family mentioned that they want IV antibiotics, IV fluids for 24 hours if she does not respond they are considering comfort measures.  ER physician spoke with Dr. Perrin Maltese regarding cholecystitis he mentioned conservative treatment due to advanced age.    PAST MEDICAL HISTORY:   Past Medical History:  Diagnosis Date  . A-fib (Robin Morales) 03/28/2016  . Arthritis   . CAD (coronary artery disease) 07/10/2014  . Chronic ischemic left PCA stroke 04/14/2016   Overview:  Left PCA  . CVA (cerebral vascular accident) ([place])   . Diabetes mellitus without complication ([place])   . Gastroesophageal reflux disease without esophagitis 07/05/2015  . Hypercholesterolemia   . Hyperlipidemia 07/10/2014  . Hypertension   . MI (myocardial infarction) ([place])   . TIA (transient ischemic attack) 03/28/2016  . Type 2 diabetes mellitus with diabetic nephropathy ([place]) 01/14/2016    PAST SURGICAL HISTOIRY:   Past  Surgical History:  Procedure Laterality Date  . HIP ARTHROPLASTY      SOCIAL HISTORY:   Social History   Tobacco Use  . Smoking status: Never Smoker  . Smokeless tobacco: Never Used  Substance Use Topics  . Alcohol use: No    FAMILY HISTORY:   Family History  Problem Relation Age of Onset  . Hypertension Daughter   . Diabetes Sister   . Prostate cancer Neg Hx     DRUG ALLERGIES:   Allergies  Allergen Reactions  . Gabapentin     Other reaction(s): Dizziness  . Keflex [Cephalexin] Itching    REVIEW OF SYSTEMS:  Unable to obtain review of systems because of unresponsiveness.  MEDICATIONS AT HOME:   Prior to Admission medications   Medication Sig Start Date End Date Taking? Authorizing Provider  acetaminophen (TYLENOL) 500 MG tablet Take 500 mg by mouth every 6 (six) hours as needed for mild pain.    Yes [provider]  alendronate (FOSAMAX) 70 MG tablet Take 70 mg by mouth every Thursday.  03/12/18  Yes [provider]  apixaban (ELIQUIS) 2.5 MG TABS tablet Take 1 tablet (2.5 mg total) by mouth 2 (two) times daily. 03/29/16  Yes Max Sane, MD  atorvastatin (LIPITOR) 40 MG tablet Take 1 tablet (40 mg total) by mouth daily at 6 PM. Patient taking differently: Take 40 mg by mouth daily.  03/29/16  Yes Max Sane, MD  Chlorpheniramine-Acetaminophen (CORICIDIN HBP COLD/FLU PO) Take 1 tablet by mouth daily as needed (cold and flu symptoms).    Yes [provider]  cholecalciferol (VITAMIN D3) 25 MCG (1000 UT) tablet Take 1,000 Units by mouth daily.   Yes [provider]  Diphenhydramine-PE-APAP (DELSYM COUGH/COLD NIGHT TIME PO) Take 1 Dose by mouth daily as needed (cough/cold symptoms).    Yes [provider]  glipiZIDE (GLUCOTROL) 5 MG tablet Take 5 mg by mouth daily before breakfast.  03/12/18  Yes [provider]  hydrochlorothiazide (HYDRODIURIL) 25 MG tablet Take 25 mg by mouth daily.   Yes [provider]   meloxicam (MOBIC) 15 MG tablet Take 15 mg by mouth daily.  02/27/18  Yes [provider]  metoprolol (LOPRESSOR) 50 MG tablet Take 50 mg by mouth 2 (two) times daily.    Yes [provider]  omeprazole (PRILOSEC) 40 MG capsule Take 1 capsule by mouth daily. 03/18/16  Yes [provider]  risperiDONE (RISPERDAL) 0.25 MG tablet Take 0.25 mg by mouth 2 (two) times daily.   Yes [provider]  sodium bicarbonate 325 MG tablet Take 325 mg by mouth 4 (four) times daily.   Yes [provider]  vitamin B-12 (CYANOCOBALAMIN) 1000 MCG tablet Take 1,000 mcg by mouth daily.   Yes [provider]      VITAL SIGNS:  Blood pressure (!) 112/45, pulse 70, temperature (!) 100.7 F (38.2 C), temperature source Oral, resp. rate 12, height 5\' 2"  (1.575 m), weight 70.3 kg, SpO2 95 %.  PHYSICAL EXAMINATION:  GENERAL:  84 y.o.-year-old patient lying in the bed, unresponsive.Marland Kitchen  Appears  pale EYES: Pupils equal, round, reactive to light o scleral icterus. Extraocular muscles intact.  HEENT: Head atraumatic, normocephalic.  Mucosa is dry.  NECK:  Supple, no jugular venous distention. No thyroid enlargement,  LUNGS: Normal breath sounds bilaterally, no wheezing, rales,rhonchi or crepitation. No use of accessory muscles of respiration.  CARDIOVASCULAR: S1, S2 normal. No murmurs, rubs, or gallops.  ABDOMEN: Bowel sounds present, no organomegaly.  No tenderness appreciated. EXTREMITIES: No pedal edema, cyanosis, or clubbing.  NEUROLOGIC: Patient is unresponsive so unable to do full neuro exam. PSYCHIATRIC: The patient is unresponsive.   SKIN: No obvious rash, lesion, or ulcer.   LABORATORY PANEL:   CBC Recent Labs  Lab 12/03/18 1405  WBC 21.8*  HGB 10.8*  HCT 33.6*  PLT 253   ------------------------------------------------------------------------------------------------------------------  Chemistries  Recent Labs  Lab 12/03/18 1405  NA 132*  K 5.4*   CL 100  CO2 22  GLUCOSE 238*  BUN 68*  CREATININE 2.78*  CALCIUM 8.4*  AST 25  ALT 13  ALKPHOS 81  BILITOT 1.1   ------------------------------------------------------------------------------------------------------------------  Cardiac Enzymes No results for input(s): TROPONINI in the last 168 hours. ------------------------------------------------------------------------------------------------------------------  RADIOLOGY:  Ct Abdomen Pelvis Wo Contrast  Result Date: 12/03/2018 CLINICAL DATA:  Weakness and abdominal pain EXAM: CT ABDOMEN AND PELVIS WITHOUT CONTRAST TECHNIQUE: Multidetector CT imaging of the abdomen and pelvis was performed following the standard protocol without IV contrast. COMPARISON:  03/11/2017 FINDINGS: Lower chest: No acute abnormality. Hepatobiliary: The liver demonstrates evidence of pneumobilia. The gallbladder is well distended and demonstrates what appears to be a large gallstone within. Additionally there is air within the wall of the gallbladder and small foci of air surrounding the gallbladder consistent with emphysematous cholecystitis and likely contained perforation. Air is also noted within the common bile duct. This would correspond with the pneumobilia seen within the liver. Pancreas: Fatty infiltration of the pancreas is noted. No focal mass is noted. Spleen: Normal in size without focal abnormality. Adrenals/Urinary Tract: Stable left adrenal  lesion is noted measuring 2.1 cm. Right adrenal gland is unremarkable. The left kidney demonstrates no evidence of renal calculi or urinary tract obstructive changes. The right kidney is somewhat malrotated and demonstrates stones within the lower pole measuring 12 mm in greatest dimension. This has increased slightly in the interval from the prior exam at which time it measured 1 cm. Some small calcifications are noted along the margin of the right kidney which are stable in appearance from the prior exam.  Bladder is partially distended. Stomach/Bowel: Prominent fecal material is noted within the rectal vault which may represent a mild degree of rectal impaction. Diverticular changes noted without definitive diverticulitis. Some inflammatory changes are noted in the region of the hepatic flexure likely related to the associated gallbladder changes. The appendix is well seen and within normal limits. Small bowel shows no obstructive changes. A fatty lesion is noted within the second portion of the duodenum consistent with small lipoma. This is stable from the previous exam. Vascular/Lymphatic: Diffuse aortic calcifications are noted. Mild ectasia is seen to 2.8 cm. No significant lymphadenopathy is identified. Reproductive: Uterus and bilateral adnexa are unremarkable. Other: No abdominal wall hernia or abnormality. No abdominopelvic ascites. Musculoskeletal: Right hip replacement is noted. Degenerative changes of the lumbar spine are seen. No compression deformities are noted. IMPRESSION: Changes consistent with emphysematous cholecystitis with what appears to be a gallstone within the gallbladder. Associated pneumobilia is seen as well as some air adjacent to the gallbladder suggestive of contained rupture. Changes of diverticulosis. No definitive diverticulitis is seen although some inflammatory changes in the hepatic flexure likely related to the gallbladder changes are noted. Stable left adrenal lesion. Lower pole right renal calculus with mild increase in size of 2 mm when compared with the prior exam. Mild ectasia of the abdominal aorta. Critical Value/emergent results were called by telephone at the time of interpretation on 12/03/2018 at 4:10 pm to Dr. Rudene Re , who verbally acknowledged these results. Electronically Signed   By: Inez Catalina M.D.   On: 12/03/2018 16:15   Dg Chest 2 View  Result Date: 12/03/2018 CLINICAL DATA:  Weakness EXAM: CHEST - 2 VIEW COMPARISON:  01/28/2017 FINDINGS:  Cardiac shadow is mildly enlarged but stable. Aortic calcifications are seen. The lungs are well aerated bilaterally without focal infiltrate or sizable effusion. No acute bony abnormality is noted. IMPRESSION: No acute abnormality seen. Electronically Signed   By: Inez Catalina M.D.   On: 12/03/2018 14:58    EKG:   Orders placed or performed during the hospital encounter of 12/03/18  . ED EKG 12-Lead  . ED EKG 12-Lead   EKG shows sinus rhythm with first-degree AV block, normal QRS, no ST-T changes. IMPRESSION AND PLAN:  84 year old female patient with multiple medical problems of chronic atrial fibrillation, dementia, hypertension brought in by family because of acute diarrhea for 2 days also history of general weakness today.  #1  .Acute emphysematous cholecystitis due to gallstone in the gallbladder associated pneumobilia, gallbladder rupture possibly causing her acute illness: Patient has advanced age, poor surgical candidate, medical management advised patient family wants 24 hours of IV antibiotics, fluids and they understand that patient condition is critical, would like IV antibiotics and fluids for 24 hours and if she does not respond well to consider comfort measures. 2.  Sepsis due to gallbladder rupture, acute renal failure: Continue IV Zosyn, IV hydration for 24 hours 3.  Patient has multiple medical comorbidities including A. fib, dementia, debility comes in with sepsis,  cholecystitis, gallbladder rupture with pneumobilia, patient condition critical, patient is not responsive.  Family does not want aggressive intervention, procedures, cholecystostomy tube, consult palliative care, possible hospice placement tomorrow after discussing with family.   All the records are reviewed and case discussed with ED provider. Management plans discussed with the patient, family and they are in agreement.  CODE STATUS: DNR  TOTAL TIME TAKING CARE OF THIS PATIENT: 62minutes.    Epifanio Lesches M.D on 12/03/2018 at 7:31 PM  Between 7am to 6pm - Pager - [phone]  After 6pm go to [email or web address] - password [email or web address] [facility]  Office  [phone]  CC: Primary care physician; Baxter Hire, MD  Note: This dictation was prepared with Dragon dictation along with smaller phrase technology. Any transcriptional errors that result from this process are unintentional.

## 2018-12-03 NOTE — ED Notes (Signed)
Pt only responding to sternal rub, only made facial expression with first sternal rub. Pt not responding to verbal stimulation. With additional sternal rub, pt opened eyes and said "ow that hurts" then quickly closed eyes again without answering RN LOC questions

## 2018-12-03 NOTE — Progress Notes (Signed)
Pharmacy Antibiotic Note  Robin Morales is a 84 y.o. female admitted on 12/03/2018 with IAI likely acute cholecystitis.  Pharmacy has been consulted for Zosyn dosing. Pt received a dose of Zosyn in the ED.   Plan: Zosyn 3.375 g IV q12h based on renal function  Height: 5\' 2"  (157.5 cm) Weight: 155 lb (70.3 kg) IBW/kg (Calculated) : 50.1  Temp (24hrs), Avg:100.7 F (38.2 C), Min:100.7 F (38.2 C), Max:100.7 F (38.2 C)  Recent Labs  Lab 12/03/18 1402 12/03/18 1405  WBC  --  21.8*  CREATININE  --  2.78*  LATICACIDVEN 1.58  --     Estimated Creatinine Clearance: 12.4 mL/min (A) (by C-G formula based on SCr of 2.78 mg/dL (H)).    Allergies  Allergen Reactions  . Gabapentin     Other reaction(s): Dizziness  . Keflex [Cephalexin] Itching    Antimicrobials this admission: Zosyn 12/10 >>  Dose adjustments this admission:   Microbiology results: 12/10 [id number]  Thank you for allowing pharmacy to be a part of this patient's care.  Rocky Morel 12/03/2018 7:24 PM

## 2018-12-03 NOTE — Consult Note (Signed)
CODE SEPSIS - PHARMACY COMMUNICATION  **Broad Spectrum Antibiotics should be administered within 1 hour of Sepsis diagnosis**  Time Code Sepsis Called/Page Received: 6582  If necessary, Name of Provider/Nurse Contacted: This order panel was used to place orders for a possible code sepsis. Dr Alfred Levins will order a follow-up sepsis order if patient meets sepsis criteria.  Dallie Piles ,PharmD Clinical Pharmacist  12/03/2018  2:20 PM

## 2018-12-04 DIAGNOSIS — R6520 Severe sepsis without septic shock: Secondary | ICD-10-CM

## 2018-12-04 DIAGNOSIS — N179 Acute kidney failure, unspecified: Secondary | ICD-10-CM

## 2018-12-04 DIAGNOSIS — L8990 Pressure ulcer of unspecified site, unspecified stage: Secondary | ICD-10-CM

## 2018-12-04 DIAGNOSIS — A419 Sepsis, unspecified organism: Principal | ICD-10-CM

## 2018-12-04 DIAGNOSIS — Z7189 Other specified counseling: Secondary | ICD-10-CM

## 2018-12-04 LAB — BASIC METABOLIC PANEL
Anion gap: 10 (ref 5–15)
BUN: 67 mg/dL — ABNORMAL HIGH (ref 8–23)
CO2: 20 mmol/L — ABNORMAL LOW (ref 22–32)
Calcium: 7.7 mg/dL — ABNORMAL LOW (ref 8.9–10.3)
Chloride: 107 mmol/L (ref 98–111)
Creatinine, Ser: 2.77 mg/dL — ABNORMAL HIGH (ref 0.44–1.00)
GFR calc non Af Amer: 14 mL/min — ABNORMAL LOW (ref >60)
GFR, EST AFRICAN AMERICAN: 17 mL/min — AB (ref >60)
Glucose, Bld: 79 mg/dL (ref 70–99)
Potassium: 4.2 mmol/L (ref 3.5–5.1)
Sodium: 137 mmol/L (ref 135–145)

## 2018-12-04 LAB — CBC
HCT: 29.8 % — ABNORMAL LOW (ref 36.0–46.0)
Hemoglobin: 9.6 g/dL — ABNORMAL LOW (ref 12.0–15.0)
MCH: 30.5 pg (ref 26.0–34.0)
MCHC: 32.2 g/dL (ref 30.0–36.0)
MCV: 94.6 fL (ref 80.0–100.0)
Platelets: 217 10*3/uL (ref 150–400)
RBC: 3.15 MIL/uL — ABNORMAL LOW (ref 3.87–5.11)
RDW: 12.4 % (ref 11.5–15.5)
WBC: 24.7 10*3/uL — ABNORMAL HIGH (ref 4.0–10.5)
nRBC: 0 % (ref 0.0–0.2)

## 2018-12-04 LAB — GLUCOSE, CAPILLARY
Glucose-Capillary: 62 mg/dL — ABNORMAL LOW (ref 70–99)
Glucose-Capillary: 114 mg/dL — ABNORMAL HIGH (ref 70–99)

## 2018-12-04 MED ORDER — MORPHINE SULFATE (PF) 2 MG/ML IV SOLN: 1.0000 mg | INTRAVENOUS | Status: DC | PRN

## 2018-12-04 MED ORDER — TRAZODONE HCL 50 MG PO TABS: 25.0000 mg | ORAL_TABLET | Freq: Every evening | ORAL | Status: DC | PRN

## 2018-12-04 NOTE — Progress Notes (Signed)
[facility] [place] NAME: Robin Morales    MR#:  [id number]  DATE OF BIRTH:  1926-04-09  SUBJECTIVE:   Resting dter in the room Pt did not talkmuch REVIEW OF SYSTEMS:   Review of Systems  Unable to perform ROS: Medical condition   Tolerating Diet: Tolerating PT:   DRUG ALLERGIES:   Allergies  Allergen Reactions  . Gabapentin     Other reaction(s): Dizziness  . Keflex [Cephalexin] Itching    VITALS:  Blood pressure (!) 123/59, pulse (!) 108, temperature 98.4 F (36.9 C), temperature source Oral, resp. rate 19, height 4\' 10"  (1.473 m), weight 72 kg, SpO2 95 %.  PHYSICAL EXAMINATION:   Physical Examlimited exam  GENERAL:  84 y.o.-year-old patient lying in the bed with no acute distress.  EYES: Pupils equal, round, reactive to light and accommodation. No scleral icterus. Extraocular muscles intact.  HEENT: Head atraumatic, normocephalic. Oropharynx and nasopharynx clear.  NECK:  Supple, no jugular venous distention. No thyroid enlargement, no tenderness.  LUNGS: Normal breath sounds bilaterally, no wheezing, rales, rhonchi. No use of accessory muscles of respiration.  CARDIOVASCULAR: S1, S2 normal. No murmurs, rubs, or gallops.  ABDOMEN: Soft, ++ tender, nondistended. Bowel sounds present. No organomegaly or mass.  EXTREMITIES: No cyanosis, clubbing or edema b/l.    NEUROLOGIC: unable to exam PSYCHIATRIC:  patient is a bit lethargicSKIN: No obvious rash, lesion, or ulcer.   LABORATORY PANEL:  CBC Recent Labs  Lab 12/04/18 0657  WBC 24.7*  HGB 9.6*  HCT 29.8*  PLT 217    Chemistries  Recent Labs  Lab 12/03/18 1405 12/04/18 0657  NA 132* 137  K 5.4* 4.2  CL 100 107  CO2 22 20*  GLUCOSE 238* 79  BUN 68* 67*  CREATININE 2.78* 2.77*  CALCIUM 8.4* 7.7*  AST 25  --   ALT 13  --   ALKPHOS 81  --   BILITOT 1.1  --    Cardiac Enzymes No results for input(s): TROPONINI in the last 168 hours. RADIOLOGY:   Ct Abdomen Pelvis Wo Contrast  Result Date: 12/03/2018 CLINICAL DATA:  Weakness and abdominal pain EXAM: CT ABDOMEN AND PELVIS WITHOUT CONTRAST TECHNIQUE: Multidetector CT imaging of the abdomen and pelvis was performed following the standard protocol without IV contrast. COMPARISON:  03/11/2017 FINDINGS: Lower chest: No acute abnormality. Hepatobiliary: The liver demonstrates evidence of pneumobilia. The gallbladder is well distended and demonstrates what appears to be a large gallstone within. Additionally there is air within the wall of the gallbladder and small foci of air surrounding the gallbladder consistent with emphysematous cholecystitis and likely contained perforation. Air is also noted within the common bile duct. This would correspond with the pneumobilia seen within the liver. Pancreas: Fatty infiltration of the pancreas is noted. No focal mass is noted. Spleen: Normal in size without focal abnormality. Adrenals/Urinary Tract: Stable left adrenal lesion is noted measuring 2.1 cm. Right adrenal gland is unremarkable. The left kidney demonstrates no evidence of renal calculi or urinary tract obstructive changes. The right kidney is somewhat malrotated and demonstrates stones within the lower pole measuring 12 mm in greatest dimension. This has increased slightly in the interval from the prior exam at which time it measured 1 cm. Some small calcifications are noted along the margin of the right kidney which are stable in appearance from the prior exam. Bladder is partially distended. Stomach/Bowel: Prominent fecal material is noted within the rectal vault which may represent  a mild degree of rectal impaction. Diverticular changes noted without definitive diverticulitis. Some inflammatory changes are noted in the region of the hepatic flexure likely related to the associated gallbladder changes. The appendix is well seen and within normal limits. Small bowel shows no obstructive changes. A fatty  lesion is noted within the second portion of the duodenum consistent with small lipoma. This is stable from the previous exam. Vascular/Lymphatic: Diffuse aortic calcifications are noted. Mild ectasia is seen to 2.8 cm. No significant lymphadenopathy is identified. Reproductive: Uterus and bilateral adnexa are unremarkable. Other: No abdominal wall hernia or abnormality. No abdominopelvic ascites. Musculoskeletal: Right hip replacement is noted. Degenerative changes of the lumbar spine are seen. No compression deformities are noted. IMPRESSION: Changes consistent with emphysematous cholecystitis with what appears to be a gallstone within the gallbladder. Associated pneumobilia is seen as well as some air adjacent to the gallbladder suggestive of contained rupture. Changes of diverticulosis. No definitive diverticulitis is seen although some inflammatory changes in the hepatic flexure likely related to the gallbladder changes are noted. Stable left adrenal lesion. Lower pole right renal calculus with mild increase in size of 2 mm when compared with the prior exam. Mild ectasia of the abdominal aorta. Critical Value/emergent results were called by telephone at the time of interpretation on 12/03/2018 at 4:10 pm to Dr. Rudene Re , who verbally acknowledged these results. Electronically Signed   By: Inez Catalina M.D.   On: 12/03/2018 16:15   Dg Chest 2 View  Result Date: 12/03/2018 CLINICAL DATA:  Weakness EXAM: CHEST - 2 VIEW COMPARISON:  01/28/2017 FINDINGS: Cardiac shadow is mildly enlarged but stable. Aortic calcifications are seen. The lungs are well aerated bilaterally without focal infiltrate or sizable effusion. No acute bony abnormality is noted. IMPRESSION: No acute abnormality seen. Electronically Signed   By: Inez Catalina M.D.   On: 12/03/2018 14:58   ASSESSMENT AND PLAN:  Robin Morales  is a 84 y.o. female with a known history of diabetes mellitus type 2, essential hypertension,  hyperlipidemia, chronic A. fib brought in by family because of 2-day history of diarrhea, generalized weakness, poor p.o. Intake.  1..Acute emphysematous cholecystitis due to gallstone in the gallbladder associated pneumobilia, gallbladder rupture possibly causing her acute illness: - Patient has advanced age, poor surgical candidate, medical management advised patient family wants 24 hours of IV antibiotics, fluids and they understand that patient condition is critical, would like IV antibiotics and fluids for 24 hours and if she does not respond well to consider comfort measures. -discussed home with hospice and or to hospice facility  2.  Sepsis due to gallbladder rupture, acute renal failure: Continue IV Zosyn, IV hydration for 24 hours -Surgery input appreciated  3.  generalized deconditioning Pt has not shown any clinical improvement with labs also Cont present mnx and once family decides d/c plan assist in the same  Case discussed with Care Management/Social Worker. Management plans discussed with the patient, family and they are in agreement.  CODE STATUS: DNR    TOTAL TIME TAKING CARE OF THIS PATIENT: *25* minutes.  >50% time spent on counselling and coordination of care  POSSIBLE D/C IN *?* DAYS, DEPENDING ON CLINICAL CONDITION.  Note: This dictation was prepared with Dragon dictation along with smaller phrase technology. Any transcriptional errors that result from this process are unintentional.  Fritzi Mandes M.D on 12/04/2018 at 8:29 PM  Between 7am to 6pm - Pager - [phone]  After 6pm go to [email or web address] - [place]  [place]  [phone]  CC: Primary care physician; Baxter Hire, MDPatient ID: Robin Morales, female   DOB: 1926/04/15, 84 y.o.   MRN: [id number]

## 2018-12-04 NOTE — Consult Note (Signed)
Consultation Note Date: 12/04/2018   Patient Name: Robin Morales  DOB: [birth year]  MRN: [id number]  Age / Sex: 84 y.o., female  PCP: Baxter Hire, MD Referring Physician: Fritzi Mandes, MD  Reason for Consultation: Establishing goals of care  HPI/Patient Profile: 84 y.o. female  with past medical history of a fib (on chronic anticoag), remote CVA, HTN  admitted on 12/03/2018 with sepsis r/t UTI and cholelithiasis with possible contained rupture of gallbladder. Patient was minimally responsive no admission and per discussion with family and ED MD- plan was made for conservative care with IV antibiotics and fluid, and transition to comfort only if patient did not improve after 24 hours. Palliative medicine consulted for Robin Morales.  Clinical Assessment and Goals of Care:  I have reviewed medical records including EPIC notes, labs and imaging, assessed the patient and then met at the bedside along with patient's daughter- Robin Morales- to discuss diagnosis prognosis, [place], EOL wishes, disposition and options.  Patient was awake and alert in bed. C/O pain in her abdomen and back. Her daughter states she has not slept since midnight and patient is known to become delirious without sleep. Robin Morales is able to tell me her name, and that she feels "terrible". However, she is unable to tell me where we are. She answers some questions with statements that are not in context to the questions- for example when asked about where she is, she tells me about fixing dinner. She is unable to participate in [place] discussion.    I introduced Palliative Medicine as specialized medical care for people living with serious illness. It focuses on providing relief from the symptoms and stress of a serious illness. The goal is to improve quality of life for both the patient and the family.  We discussed a brief life review of the patient. She has  been living at home with her daughter Robin Morales. Functionally independent- but has noted decline- doesn't leave her home except for MD appointments due to becoming weary with activity. She ambulates in the home with a walker. No recent weight loss or other changes.   We discussed their current illness and what it means in the larger context of their on-going co-morbidities.  Natural disease trajectory and expectations at EOL were discussed. Robin Morales notes that patient is at a point in her life where comfort and quality are priorities over time. We discussed chronic nature of cholelithiasis that cannot have surgical intervention and how this affects quality of life. Discussed possible future trajectory.   The difference between aggressive medical intervention and comfort care was considered in light of the patient's goals of care.   For now- Robin Morales requests continued level of care- conservative with IV hydration, IV antibiotics- hopeful for patient to stabilize and return home.   Advanced directives, concepts specific to code status, artifical feeding and hydration, and rehospitalization were considered and discussed.  Questions and concerns were addressed.  The family was encouraged to call with questions or concerns.    Primary Decision Maker NEXT OF  KIN- patient's daughterIvin Morales and two other siblings    SUMMARY OF RECOMMENDATIONS -Morphine '1mg'$  IV q2hr prn pain -Trazodone '25mg'$  qhs prn for sleep -Cont current level of care -PMT will continue to follow and intervene if patient does not progress or shows decline     Code Status/Advance Care Planning:  DNR  Palliative Prophylaxis:   Delirium Protocol  Additional Recommendations (Limitations, Scope, Preferences):  No Surgical Procedures  Prognosis:    Unable to determine  Discharge Planning: To Be Determined  Primary Diagnoses: Present on Admission: . Sepsis ([place])   I have reviewed the medical record, interviewed the patient  and family, and examined the patient. The following aspects are pertinent.  Past Medical History:  Diagnosis Date  . A-fib ([place]) 03/28/2016  . Arthritis   . CAD (coronary artery disease) 07/10/2014  . Chronic ischemic left PCA stroke 04/14/2016   Overview:  Left PCA  . CVA (cerebral vascular accident) ([place])   . Diabetes mellitus without complication ([place])   . Gastroesophageal reflux disease without esophagitis 07/05/2015  . Hypercholesterolemia   . Hyperlipidemia 07/10/2014  . Hypertension   . MI (myocardial infarction) ([place])   . TIA (transient ischemic attack) 03/28/2016  . Type 2 diabetes mellitus with diabetic nephropathy ([place]) 01/14/2016   Social History   Socioeconomic History  . Marital status: Married    Spouse name: Not on file  . Number of children: Not on file  . Years of education: Not on file  . Highest education level: Not on file  Occupational History  . Occupation: retired  Scientific laboratory technician  . Financial resource strain: Not on file  . Food insecurity:    Worry: Not on file    Inability: Not on file  . Transportation needs:    Medical: Not on file    Non-medical: Not on file  Tobacco Use  . Smoking status: Never Smoker  . Smokeless tobacco: Never Used  Substance and Sexual Activity  . Alcohol use: No  . Drug use: No  . Sexual activity: Not on file  Lifestyle  . Physical activity:    Days per week: Not on file    Minutes per session: Not on file  . Stress: Not on file  Relationships  . Social connections:    Talks on phone: Not on file    Gets together: Not on file    Attends religious service: Not on file    Active member of club or organization: Not on file    Attends meetings of clubs or organizations: Not on file    Relationship status: Not on file  Other Topics Concern  . Not on file  Social History Narrative  . Not on file   Family History  Problem Relation Age of Onset  . Hypertension Daughter   . Diabetes Sister   . Prostate cancer Neg Hx     Scheduled Meds: . heparin  5,000 Units Subcutaneous Q8H   Continuous Infusions: . sodium chloride 100 mL/hr at 12/04/18 0814  . piperacillin-tazobactam (ZOSYN)  IV 3.375 g (12/04/18 0818)   PRN Meds:.acetaminophen **OR** acetaminophen, bisacodyl, morphine injection, ondansetron **OR** ondansetron (ZOFRAN) IV, traZODone Medications Prior to Admission:  Prior to Admission medications   Medication Sig Start Date End Date Taking? Authorizing Provider  acetaminophen (TYLENOL) 500 MG tablet Take 500 mg by mouth every 6 (six) hours as needed for mild pain.    Yes [provider]  alendronate (FOSAMAX) 70 MG tablet Take 70 mg by mouth every  Thursday.  03/12/18  Yes [provider]  apixaban (ELIQUIS) 2.5 MG TABS tablet Take 1 tablet (2.5 mg total) by mouth 2 (two) times daily. 03/29/16  Yes Max Sane, MD  atorvastatin (LIPITOR) 40 MG tablet Take 1 tablet (40 mg total) by mouth daily at 6 PM. Patient taking differently: Take 40 mg by mouth daily.  03/29/16  Yes Max Sane, MD  Chlorpheniramine-Acetaminophen (CORICIDIN HBP COLD/FLU PO) Take 1 tablet by mouth daily as needed (cold and flu symptoms).    Yes [provider]  cholecalciferol (VITAMIN D3) 25 MCG (1000 UT) tablet Take 1,000 Units by mouth daily.   Yes [provider]  Diphenhydramine-PE-APAP (DELSYM COUGH/COLD NIGHT TIME PO) Take 1 Dose by mouth daily as needed (cough/cold symptoms).    Yes [provider]  glipiZIDE (GLUCOTROL) 5 MG tablet Take 5 mg by mouth daily before breakfast.  03/12/18  Yes [provider]  hydrochlorothiazide (HYDRODIURIL) 25 MG tablet Take 25 mg by mouth daily.   Yes [provider]  meloxicam (MOBIC) 15 MG tablet Take 15 mg by mouth daily.  02/27/18  Yes [provider]  metoprolol (LOPRESSOR) 50 MG tablet Take 50 mg by mouth 2 (two) times daily.    Yes [provider]  omeprazole (PRILOSEC) 40 MG capsule Take 1 capsule by mouth daily.  03/18/16  Yes [provider]  risperiDONE (RISPERDAL) 0.25 MG tablet Take 0.25 mg by mouth 2 (two) times daily.   Yes [provider]  sodium bicarbonate 325 MG tablet Take 325 mg by mouth 4 (four) times daily.   Yes [provider]  vitamin B-12 (CYANOCOBALAMIN) 1000 MCG tablet Take 1,000 mcg by mouth daily.   Yes [provider]   Allergies  Allergen Reactions  . Gabapentin     Other reaction(s): Dizziness  . Keflex [Cephalexin] Itching   Review of Systems  Unable to perform ROS: Acuity of condition    Physical Exam  Cardiovascular: Normal rate and regular rhythm.  Pulmonary/Chest: Effort normal and breath sounds normal.  Abdominal: There is tenderness.  Neurological: She is alert.  Pleasantly confused  Skin: Skin is warm and dry.  Psychiatric: She has a normal mood and affect.  Nursing note and vitals reviewed.   Vital Signs: BP (!) 109/44 (BP Location: Right Arm)   Pulse 76   Temp 98.1 F (36.7 C) (Oral)   Resp 17   Ht '4\' 10"'$  (1.473 m)   Wt 72 kg   SpO2 94%   BMI 33.17 kg/m  Pain Scale: 0-10   Pain Score: 0-No pain   SpO2: SpO2: 94 % O2 Device:SpO2: 94 % O2 Flow Rate: .   IO: Intake/output summary:   Intake/Output Summary (Last 24 hours) at 12/04/2018 1355 Last data filed at 12/04/2018 0459 Gross per 24 hour  Intake 2100 ml  Output 300 ml  Net 1800 ml    LBM: Last BM Date: 12/03/18 Baseline Weight: Weight: 70.3 kg Most recent weight: Weight: 72 kg     Palliative Assessment/Data: PPS: 50%   Flowsheet Rows     Most Recent Value  Intake Tab  Referral Department  Hospitalist  Unit at Time of Referral  ER  Palliative Care Primary Diagnosis  Sepsis/Infectious Disease  Date Notified  12/04/18  Palliative Care Type  New Palliative care  Reason for referral  Clarify Goals of Care  Date of Admission  12/03/18  # of days IP prior to Palliative referral  1  Clinical Assessment  Psychosocial & Spiritual Assessment    Palliative Care Outcomes      Thank you for this consult. Palliative medicine will continue to follow and assist as needed.   Time In: 1300 Time Out: 1415 Time Total: 75 minutes Greater than 50%  of this time was spent counseling and coordinating care related to the above assessment and plan.  Signed by: Mariana Kaufman, AGNP-C Palliative Medicine    Please contact Palliative Medicine Team phone at [phone] for questions and concerns.  For individual provider: See Shea Evans

## 2018-12-05 LAB — BASIC METABOLIC PANEL
Anion gap: 10 (ref 5–15)
BUN: 62 mg/dL — ABNORMAL HIGH (ref 8–23)
CO2: 17 mmol/L — AB (ref 22–32)
Calcium: 6.9 mg/dL — ABNORMAL LOW (ref 8.9–10.3)
Chloride: 107 mmol/L (ref 98–111)
Creatinine, Ser: 2.47 mg/dL — ABNORMAL HIGH (ref 0.44–1.00)
GFR calc Af Amer: 19 mL/min — ABNORMAL LOW (ref >60)
GFR calc non Af Amer: 17 mL/min — ABNORMAL LOW (ref >60)
GLUCOSE: 113 mg/dL — AB (ref 70–99)
Potassium: 3.8 mmol/L (ref 3.5–5.1)
Sodium: 134 mmol/L — ABNORMAL LOW (ref 135–145)

## 2018-12-05 LAB — GLUCOSE, CAPILLARY
Glucose-Capillary: 30 mg/dL — CL (ref 70–99)
Glucose-Capillary: 42 mg/dL — CL (ref 70–99)
Glucose-Capillary: 58 mg/dL — ABNORMAL LOW (ref 70–99)
Glucose-Capillary: 97 mg/dL (ref 70–99)

## 2018-12-05 LAB — CBC
HCT: 28.3 % — ABNORMAL LOW (ref 36.0–46.0)
Hemoglobin: 9.2 g/dL — ABNORMAL LOW (ref 12.0–15.0)
MCH: 31.1 pg (ref 26.0–34.0)
MCHC: 32.5 g/dL (ref 30.0–36.0)
MCV: 95.6 fL (ref 80.0–100.0)
PLATELETS: 230 10*3/uL (ref 150–400)
RBC: 2.96 MIL/uL — ABNORMAL LOW (ref 3.87–5.11)
RDW: 12.6 % (ref 11.5–15.5)
WBC: 24.5 10*3/uL — ABNORMAL HIGH (ref 4.0–10.5)
nRBC: 0 % (ref 0.0–0.2)

## 2018-12-05 MED ORDER — PROCHLORPERAZINE EDISYLATE 10 MG/2ML IJ SOLN
5.0000 mg | INTRAMUSCULAR | Status: DC | PRN
  Administered 2018-12-05: 23:00:00 5 mg via INTRAVENOUS
  Filled 2018-12-05 (×2): qty 1

## 2018-12-05 MED ORDER — PNEUMOCOCCAL VAC POLYVALENT 25 MCG/0.5ML IJ INJ
0.5000 mL | INJECTION | INTRAMUSCULAR | Status: AC
  Administered 2018-12-08: 10:00:00 0.5 mL via INTRAMUSCULAR
  Filled 2018-12-05 (×2): qty 0.5

## 2018-12-05 NOTE — Progress Notes (Addendum)
[place] [facility] [place] NAME: Robin Morales    MR#:  [id number]  DATE OF BIRTH:  02-15-1926  SUBJECTIVE:   Alert, awake, oriented today, denies any abdominal pain, nausea, vomiting.  She knows she is in the hospital. Noted to have hypoglycemia this morning.  Patient not on insulin.  Was taking glipizide at home.  REVIEW OF SYSTEMS:   Review of Systems  Constitutional: Negative for chills and fever.  HENT: Negative for hearing loss.   Eyes: Negative for blurred vision, double vision and photophobia.  Respiratory: Negative for cough, hemoptysis and shortness of breath.   Cardiovascular: Negative for palpitations, orthopnea and leg swelling.  Gastrointestinal: Negative for abdominal pain, diarrhea and vomiting.  Genitourinary: Negative for dysuria and urgency.  Musculoskeletal: Negative for myalgias and neck pain.  Skin: Negative for rash.  Neurological: Negative for dizziness, focal weakness, seizures, weakness and headaches.  Psychiatric/Behavioral: Negative for memory loss. The patient does not have insomnia.    Tolerating Diet: Tolerating PT:   DRUG ALLERGIES:   Allergies  Allergen Reactions  . Gabapentin     Other reaction(s): Dizziness  . Keflex [Cephalexin] Itching    VITALS:  Blood pressure (!) 141/55, pulse 85, temperature 98.7 F (37.1 C), temperature source Oral, resp. rate 17, height 4\' 10"  (1.473 m), weight 71.3 kg, SpO2 95 %.  PHYSICAL EXAMINATION:   Physical Examlimited exam  GENERAL:  84 y.o.-year-old patient lying in the bed with no acute distress.,  Alert, awake, oriented today EYES: Pupils equal, round, reactive to light  No scleral icterus. Extraocular muscles intact.  HEENT: Head atraumatic, normocephalic. Oropharynx and nasopharynx clear.  NECK:  Supple, no jugular venous distention. No thyroid enlargement, no tenderness.  LUNGS: Normal breath sounds bilaterally, no wheezing, rales, rhonchi. No use of  accessory muscles of respiration.  CARDIOVASCULAR: S1, S2 normal. No murmurs, rubs, or gallops.  ABDOMEN: Soft, ++ tender, nondistended. Bowel sounds present. No organomegaly or mass.  EXTREMITIES: No cyanosis, clubbing or edema b/l.    NEUROLOGIC: Cranial nerves II through XII intact, power her left upper and lower extremities.  Sensations are intact.  No focal neurological deficit observed.    PSYCHIATRIC: Alert, awake, oriented.` cSKIN: No obvious rash, lesion, or ulcer.   LABORATORY PANEL:  CBC Recent Labs  Lab 12/04/18 0657  WBC 24.7*  HGB 9.6*  HCT 29.8*  PLT 217    Chemistries  Recent Labs  Lab 12/03/18 1405 12/04/18 0657  NA 132* 137  K 5.4* 4.2  CL 100 107  CO2 22 20*  GLUCOSE 238* 79  BUN 68* 67*  CREATININE 2.78* 2.77*  CALCIUM 8.4* 7.7*  AST 25  --   ALT 13  --   ALKPHOS 81  --   BILITOT 1.1  --    Cardiac Enzymes No results for input(s): TROPONINI in the last 168 hours. RADIOLOGY:  Ct Abdomen Pelvis Wo Contrast  Result Date: 12/03/2018 CLINICAL DATA:  Weakness and abdominal pain EXAM: CT ABDOMEN AND PELVIS WITHOUT CONTRAST TECHNIQUE: Multidetector CT imaging of the abdomen and pelvis was performed following the standard protocol without IV contrast. COMPARISON:  03/11/2017 FINDINGS: Lower chest: No acute abnormality. Hepatobiliary: The liver demonstrates evidence of pneumobilia. The gallbladder is well distended and demonstrates what appears to be a large gallstone within. Additionally there is air within the wall of the gallbladder and small foci of air surrounding the gallbladder consistent with emphysematous cholecystitis and likely contained perforation. Air is also noted  within the common bile duct. This would correspond with the pneumobilia seen within the liver. Pancreas: Fatty infiltration of the pancreas is noted. No focal mass is noted. Spleen: Normal in size without focal abnormality. Adrenals/Urinary Tract: Stable left adrenal lesion is noted  measuring 2.1 cm. Right adrenal gland is unremarkable. The left kidney demonstrates no evidence of renal calculi or urinary tract obstructive changes. The right kidney is somewhat malrotated and demonstrates stones within the lower pole measuring 12 mm in greatest dimension. This has increased slightly in the interval from the prior exam at which time it measured 1 cm. Some small calcifications are noted along the margin of the right kidney which are stable in appearance from the prior exam. Bladder is partially distended. Stomach/Bowel: Prominent fecal material is noted within the rectal vault which may represent a mild degree of rectal impaction. Diverticular changes noted without definitive diverticulitis. Some inflammatory changes are noted in the region of the hepatic flexure likely related to the associated gallbladder changes. The appendix is well seen and within normal limits. Small bowel shows no obstructive changes. A fatty lesion is noted within the second portion of the duodenum consistent with small lipoma. This is stable from the previous exam. Vascular/Lymphatic: Diffuse aortic calcifications are noted. Mild ectasia is seen to 2.8 cm. No significant lymphadenopathy is identified. Reproductive: Uterus and bilateral adnexa are unremarkable. Other: No abdominal wall hernia or abnormality. No abdominopelvic ascites. Musculoskeletal: Right hip replacement is noted. Degenerative changes of the lumbar spine are seen. No compression deformities are noted. IMPRESSION: Changes consistent with emphysematous cholecystitis with what appears to be a gallstone within the gallbladder. Associated pneumobilia is seen as well as some air adjacent to the gallbladder suggestive of contained rupture. Changes of diverticulosis. No definitive diverticulitis is seen although some inflammatory changes in the hepatic flexure likely related to the gallbladder changes are noted. Stable left adrenal lesion. Lower pole right renal  calculus with mild increase in size of 2 mm when compared with the prior exam. Mild ectasia of the abdominal aorta. Critical Value/emergent results were called by telephone at the time of interpretation on 12/03/2018 at 4:10 pm to Dr. Rudene Re , who verbally acknowledged these results. Electronically Signed   By: Inez Catalina M.D.   On: 12/03/2018 16:15   Dg Chest 2 View  Result Date: 12/03/2018 CLINICAL DATA:  Weakness EXAM: CHEST - 2 VIEW COMPARISON:  01/28/2017 FINDINGS: Cardiac shadow is mildly enlarged but stable. Aortic calcifications are seen. The lungs are well aerated bilaterally without focal infiltrate or sizable effusion. No acute bony abnormality is noted. IMPRESSION: No acute abnormality seen. Electronically Signed   By: Inez Catalina M.D.   On: 12/03/2018 14:58   ASSESSMENT AND PLAN:  Jahnavi Muratore  is a 84 y.o. female with a known history of diabetes mellitus type 2, essential hypertension, hyperlipidemia, chronic A. fib brought in by family because of 2-day history of diarrhea, generalized weakness, poor p.o. Intake.  1..Acute emphysematous cholecystitis due to gallstone in the gallbladder associated pneumobilia, gallbladder rupture possibly causing her acute illness: - Patient has advanced age, poor surgical candidate, medical management advised.  Patient is doing much better, very alert today, tolerating the diet.  Discussed with family, both daughters at bedside, they are happy with her improvement, wanted to continue IV antibiotics for another day and they plan to take her home tomorrow.  2.  Sepsis due to gallbladder rupture, acute renal failure: Continue IV Zosyn, IV hydration, check CBC, BMP today.  Patient alert, awake, oriented today, patient was unresponsive when she came.  3.  generalized deconditioning; according to family patient walks with walker at home. 4.  Diabetes mellitus type 2, patient did not get her diabetic medication because she was unresponsive when  she came, she just started getting more alert, eating little bit.  Noted to have hypoglycemia this morning.  She will continue to check blood glucose closely, give orange juice, D50 if needed.  Continue to hold oral diabetic medicines for now. History of chronic A. fib, rate controlled, resume Eliquis at discharge.  #5 UTI: Continue antibiotics, patient has history of recurrent UTIs before, urine cultures a urine sample obtained in the emergency room. 6.  History of late onset Alzheimer's dementia, patient appears pleasant and oriented today. 7 .diarrhea: Resolved. 8. acute on chronic renal failure on CKD stage III: Acute renal failure secondary to sepsis, continue IV fluids, check renal labs today morning.  Discussed with patient's daughters.  Hopefully she can go home tomorrow with antibiotics, hospice PCase discussed with Care Management/Social Worker. Management plans discussed with the patient, family and they are in agreement.  CODE STATUS: DNR    TOTAL TIME TAKING CARE OF THIS PATIENT: 8minutes.  >50% time spent on counselling and coordination of care  POSSIBLE D/C IN *?* DAYS, DEPENDING ON CLINICAL CONDITION.  Note: This dictation was prepared with Dragon dictation along with smaller phrase technology. Any transcriptional errors that result from this process are unintentional.  Epifanio Lesches M.D on 12/05/2018 at 9:36 AM  Between 7am to 6pm - Pager - [phone]  After 6pm go to [email or web address] - password [email or web address] [facility]  Office  [phone]  CC: Primary care physician; Baxter Hire, MDPatient ID: Robin Morales, female   DOB: [birth year], 84 y.o.   MRN: [id number]

## 2018-12-05 NOTE — Care Management Note (Signed)
Case Management Note  Patient Details  Name: Robin Morales MRN: [id number] Date of Birth: 12-Apr-1926  Subjective/Objective:  Admitted to [facility] with the diagnosis of sepsis. Lives with family. Daughter is Robin Morales [phone])). Dr.  Harrel Lemon is primary care physician.  [facility] in the past. No skilled facility. No home oxygen. Needs some help with activities of daily living.  Minimally responsive at this time. Palliative met with family yesterday. Discussed 24 hours of antibiotics before making any decisions. Possible comfort measures vs home with hospice vs hospice home.              Action/Plan: Will continue to follow for transition of care plans   Expected Discharge Date:                  Expected Discharge Plan:     In-House Referral:   yes  Discharge planning Services   yes  Post Acute Care Choice:    Choice offered to:     DME Arranged:    DME Agency:     HH Arranged:    HH Agency:     Status of Service:     If discussed at [employer] of Stay Meetings, dates discussed:    Additional Comments:  Shelbie Ammons, RN MSN [place] [facility] [phone] 12/05/2018, 8:13 AM

## 2018-12-05 NOTE — Progress Notes (Signed)
Called by NT regarding RN unavailable and CBG 42. Patient given 4 oz orange juice and is now eating breakfast. Patient is alert and family at bedside. NT called and to recheck CBG at approximately 9am. Madlyn Frankel, RN

## 2018-12-06 ENCOUNTER — Inpatient Hospital Stay: Payer: Medicare HMO

## 2018-12-06 LAB — GLUCOSE, CAPILLARY
GLUCOSE-CAPILLARY: 78 mg/dL (ref 70–99)
Glucose-Capillary: 89 mg/dL (ref 70–99)

## 2018-12-06 MED ORDER — IPRATROPIUM-ALBUTEROL 0.5-2.5 (3) MG/3ML IN SOLN
RESPIRATORY_TRACT | Status: AC
  Administered 2018-12-06: 3 mL via RESPIRATORY_TRACT
  Filled 2018-12-06: qty 3

## 2018-12-06 MED ORDER — FUROSEMIDE 10 MG/ML IJ SOLN
60.0000 mg | Freq: Once | INTRAMUSCULAR | Status: AC
  Administered 2018-12-06: 60 mg via INTRAVENOUS
  Filled 2018-12-06: qty 6

## 2018-12-06 MED ORDER — BUDESONIDE 0.5 MG/2ML IN SUSP
0.5000 mg | Freq: Two times a day (BID) | RESPIRATORY_TRACT | Status: DC
  Administered 2018-12-06 – 2018-12-08 (×4): 0.5 mg via RESPIRATORY_TRACT
  Filled 2018-12-06 (×4): qty 2

## 2018-12-06 MED ORDER — IPRATROPIUM-ALBUTEROL 0.5-2.5 (3) MG/3ML IN SOLN
3.0000 mL | Freq: Four times a day (QID) | RESPIRATORY_TRACT | Status: DC
  Administered 2018-12-06 – 2018-12-07 (×4): 3 mL via RESPIRATORY_TRACT
  Filled 2018-12-06 (×3): qty 3
  Filled 2018-12-06: qty 39

## 2018-12-06 MED ORDER — IOPAMIDOL (ISOVUE-300) INJECTION 61%
15.0000 mL | INTRAVENOUS | Status: AC
  Administered 2018-12-06 (×2): 15 mL via ORAL

## 2018-12-06 NOTE — Progress Notes (Signed)
Pharmacy Antibiotic Note  Robin Morales is a 84 y.o. female admitted on 12/03/2018 with IAI likely acute cholecystitis.  Pharmacy has been consulted for Zosyn dosing. Her SCr has improved since admission but not back to baseline. Patient's family is requesting another CAT scan of abdomen for follow-up of gallbladder perforation before making further decision about further antibiotics/hospice. This is day number 4 og IV Zosyn  Plan: Continue Zosyn 3.375 g IV q12h based on renal function  Height: 4\' 10"  (147.3 cm) Weight: 177 lb 4.8 oz (80.4 kg) IBW/kg (Calculated) : 40.9  Temp (24hrs), Avg:97.8 F (36.6 C), Min:97.4 F (36.3 C), Max:98.3 F (36.8 C)  Recent Labs  Lab 12/03/18 1402 12/03/18 1405 12/04/18 0657 12/05/18 0951  WBC  --  21.8* 24.7* 24.5*  CREATININE  --  2.78* 2.77* 2.47*  LATICACIDVEN 1.58  --   --   --     Estimated Creatinine Clearance: 13.6 mL/min (A) (by C-G formula based on SCr of 2.47 mg/dL (H)).    Allergies  Allergen Reactions  . Gabapentin     Other reaction(s): Dizziness  . Keflex [Cephalexin] Itching    Antimicrobials this admission: Zosyn 12/10 >>  Microbiology results: 12/10 BCx: pending  Thank you for allowing pharmacy to be a part of this patient's care.  Dallie Piles, PharmD 12/06/2018 10:14 AM

## 2018-12-06 NOTE — Progress Notes (Signed)
[place] [facility] [place] NAME: Robin Morales    MR#:  [id number]  DATE OF BIRTH:  [birth year]  SUBJECTIVE:   Complains of having to use commode to have a BM.  Has slight right upper quadrant pain.  Family is at bedside.  Requesting another CAT scan of abdomen for follow-up of gallbladder perforation before making further decision about further antibiotics/hospice.  Patient is alert, awake, oriented and does not want to have a BM in her diaper and wants to use commode.  According to family patient has chronic diarrhea on and off for long time.  Discontinue enteric precautions as I discussed with patient's registered nurse yesterday.  REVIEW OF SYSTEMS:   Review of Systems  Constitutional: Negative for chills and fever.  HENT: Negative for hearing loss.   Eyes: Negative for blurred vision, double vision and photophobia.  Respiratory: Negative for cough, hemoptysis and shortness of breath.   Cardiovascular: Negative for palpitations, orthopnea and leg swelling.  Gastrointestinal: Positive for abdominal pain and diarrhea. Negative for vomiting.  Genitourinary: Negative for dysuria and urgency.  Musculoskeletal: Negative for myalgias and neck pain.  Skin: Negative for rash.  Neurological: Negative for dizziness, focal weakness, seizures, weakness and headaches.  Psychiatric/Behavioral: Negative for memory loss. The patient does not have insomnia.    Tolerating Diet: Tolerating PT:   DRUG ALLERGIES:   Allergies  Allergen Reactions  . Gabapentin     Other reaction(s): Dizziness  . Keflex [Cephalexin] Itching    VITALS:  Blood pressure 134/69, pulse 86, temperature 98.6 F (37 C), temperature source Oral, resp. rate 16, height 4\' 10"  (1.473 m), weight 80.4 kg, SpO2 94 %.  PHYSICAL EXAMINATION:   Physical Examlimited exam  GENERAL:  84 y.o.-year-old patient lying in the bed with no acute distress.,  Alert, awake, oriented today EYES:  Pupils equal, round, reactive to light  No scleral icterus. Extraocular muscles intact.  HEENT: Head atraumatic, normocephalic. Oropharynx and nasopharynx clear.  NECK:  Supple, no jugular venous distention. No thyroid enlargement, no tenderness.  LUNGS: Normal breath sounds bilaterally, no wheezing, rales, rhonchi. No use of accessory muscles of respiration.  CARDIOVASCULAR: S1, S2 normal. No murmurs, rubs, or gallops.  ABDOMEN: Soft, ++ tender, nondistended. Bowel sounds present. No organomegaly or mass.  EXTREMITIES: No cyanosis, clubbing or edema b/l.    NEUROLOGIC: Cranial nerves II through XII intact, power her left upper and lower extremities.  Sensations are intact.  No focal neurological deficit observed.    PSYCHIATRIC: Alert, awake, oriented.` cSKIN: No obvious rash, lesion, or ulcer.   LABORATORY PANEL:  CBC Recent Labs  Lab 12/05/18 0951  WBC 24.5*  HGB 9.2*  HCT 28.3*  PLT 230    Chemistries  Recent Labs  Lab 12/03/18 1405  12/05/18 0951  NA 132*   < > 134*  K 5.4*   < > 3.8  CL 100   < > 107  CO2 22   < > 17*  GLUCOSE 238*   < > 113*  BUN 68*   < > 62*  CREATININE 2.78*   < > 2.47*  CALCIUM 8.4*   < > 6.9*  AST 25  --   --   ALT 13  --   --   ALKPHOS 81  --   --   BILITOT 1.1  --   --    < > = values in this interval not displayed.   Cardiac Enzymes No results for  input(s): TROPONINI in the last 168 hours. RADIOLOGY:  No results found. ASSESSMENT AND PLAN:  Robin Morales  is a 84 y.o. female with a known history of diabetes mellitus type 2, essential hypertension, hyperlipidemia, chronic A. fib brought in by family because of 2-day history of diarrhea, generalized weakness, poor p.o. Intake.  1..Acute emphysematous cholecystitis due to gallstone in the gallbladder associated pneumobilia, gallbladder rupture possibly causing her acute illness: - Patient has advanced age, poor surgical candidate, medical management advised.  Continue IV antibiotics,  patient still has significant leukocytosis, request added CT abdomen without contrast as per family wishes.  2.  Sepsis due to gallbladder rupture, acute renal failure: Continue IV Zosyn, IV hydration, still has leukocytosis, WBC up to 24.5.  Blood cultures did not show any growth yet.   3.  generalized deconditioning; according to family patient walks with walker at home.  Uses bedside commode, will request PT evaluation.   4.  Diabetes mellitus type 2, ;hypo glycemia: Resolved.  Encourage p.o. intake.  History of chronic A. fib, rate controlled, resume Eliquis at discharge.  #5 UTI: Continue antibiotics, patient has history of recurrent UTIs before, urine cultures not obtained in the emergency room, added to the sample.     6.  History of late onset Alzheimer's dementia, patient appears pleasant and oriented today.   7 chronic diarrhea: Patient family mentioned that patient has  diarrhea for long time.    8. acute on chronic renal failure on CKD stage III: Acute renal failure secondary to sepsis, continue IV fluids,     Plan patient still has right upper quadrant pain, leukocytosis, family is requesting CT abdomen without contrast to follow-up on gallbladder rupture and depending on the result they will decide about hospice/further continuation of antibiotics.   disCussed with patient's daughter, granddaughter at bedside.   PCase discussed with Care Management/Social Worker. Management plans discussed with the patient, family and they are in agreement.  CODE STATUS: DNR    TOTAL TIME TAKING CARE OF THIS PATIENT: 31minutes.  >50% time spent on counselling and coordination of care  POSSIBLE D/C IN *?* DAYS, DEPENDING ON CLINICAL CONDITION.  Note: This dictation was prepared with Dragon dictation along with smaller phrase technology. Any transcriptional errors that result from this process are unintentional.  Epifanio Lesches M.D on 12/06/2018 at 10:40 AM  Between 7am  to 6pm - Pager - [phone]  After 6pm go to [email or web address] - password [email or web address] [facility]  Office  [phone]  CC: Primary care physician; Baxter Hire, MDPatient ID: Robin Morales, female   DOB: [birth year], 84 y.o.   MRN: [id number]

## 2018-12-06 NOTE — Progress Notes (Signed)
Patient coughing with family at bedside, family stated they were assisting the patient to eat and started coughing. Patient's sats 88% on RA, applied 2L 02, sats up to 92%, notified respiratory and on call. Received verbal order from Dr. Leslye Peer for one time dose of lasix 60mg  and chest x ray. Patient having increased wheezing throughout both lungs. Dr. Leslye Peer at bedside.

## 2018-12-06 NOTE — Progress Notes (Signed)
Patient wheezing/ coughing, patient's sats 95% on RA, bladder scanned patient, >917mls in urine, notified Dr. Vianne Bulls, received verbal order to stop fluids and insert indwelling foley catheter.

## 2018-12-06 NOTE — Progress Notes (Signed)
Patient ID: Robin Morales, female   DOB: Jun 19, 1926, 84 y.o.   MRN: [id number]  [facility] PROGRESS NOTE  Robin Morales QQI:[id number] DOB: 05/23/1926 DOA: 12/03/2018 PCP: Baxter Hire, MD  HPI/Subjective: Called to see patient little bit earlier about wheezing.  Lasix was given as I was coming up to see the patient.  The patient ate dinner and the last bite then started having a little bit of wheezing.  She feels her breathing is okay.  Some cough.  Objective: Vitals:   12/06/18 1834 12/06/18 2002  BP:    Pulse:    Resp:    Temp:    SpO2: 95% 95%    Filed Weights   12/04/18 0351 12/05/18 0436 12/06/18 0420  Weight: 72 kg 71.3 kg 80.4 kg    ROS: Review of Systems  Constitutional: Negative for chills and fever.  Eyes: Negative for blurred vision.  Respiratory: Positive for wheezing. Negative for cough and shortness of breath.   Cardiovascular: Negative for chest pain.  Gastrointestinal: Positive for abdominal pain. Negative for constipation, diarrhea, nausea and vomiting.  Genitourinary: Negative for dysuria.  Musculoskeletal: Negative for joint pain.  Neurological: Negative for dizziness and headaches.   Exam: Physical Exam  HENT:  Nose: No mucosal edema.  Mouth/Throat: No oropharyngeal exudate or posterior oropharyngeal edema.  Eyes: Pupils are equal, round, and reactive to light. Conjunctivae, EOM and lids are normal.  Neck: No JVD present. Carotid bruit is not present. No edema present. No thyroid mass and no thyromegaly present.  Cardiovascular: S1 normal and S2 normal. Exam reveals no gallop.  No murmur heard. Pulses:      Dorsalis pedis pulses are 2+ on the right side and 2+ on the left side.  Respiratory: No respiratory distress. She has decreased breath sounds in the right lower field and the left lower field. She has no wheezes. She has no rhonchi. She has no rales.  Upper airway transmitted wheeze  GI: Soft. Bowel sounds are normal. There is no  abdominal tenderness.  Musculoskeletal:     Right ankle: She exhibits no swelling.     Left ankle: She exhibits no swelling.  Lymphadenopathy:    She has no cervical adenopathy.  Neurological: She is alert. No cranial nerve deficit.  Skin: Skin is warm. No rash noted. Nails show no clubbing.  Psychiatric: She has a normal mood and affect.      Data Reviewed: Basic Metabolic Panel: Recent Labs  Lab 12/03/18 1405 12/04/18 0657 12/05/18 0951  NA 132* 137 134*  K 5.4* 4.2 3.8  CL 100 107 107  CO2 22 20* 17*  GLUCOSE 238* 79 113*  BUN 68* 67* 62*  CREATININE 2.78* 2.77* 2.47*  CALCIUM 8.4* 7.7* 6.9*   Liver Function Tests: Recent Labs  Lab 12/03/18 1405  AST 25  ALT 13  ALKPHOS 81  BILITOT 1.1  PROT 7.4  ALBUMIN 3.7   CBC: Recent Labs  Lab 12/03/18 1405 12/04/18 0657 12/05/18 0951  WBC 21.8* 24.7* 24.5*  NEUTROABS 18.8*  --   --   HGB 10.8* 9.6* 9.2*  HCT 33.6* 29.8* 28.3*  MCV 96.0 94.6 95.6  PLT 253 217 230    CBG: Recent Labs  Lab 12/05/18 0843 12/05/18 0911 12/05/18 0938 12/06/18 0811 12/06/18 1659  GLUCAP 42* 58* 97 89 78    Recent Results (from the past 240 hour(s))  Blood Culture (routine x 2)     Status: None (Preliminary result)   Collection Time:  12/03/18  2:05 PM  Result Value Ref Range Status   Specimen Description BLOOD LEFT ANTECUBITAL  Final   Special Requests   Final    BOTTLES DRAWN AEROBIC AND ANAEROBIC Blood Culture adequate volume   Culture   Final    NO GROWTH 3 DAYS Performed at [facility], [address], [place], [place] [address]    Report Status PENDING  Incomplete  Blood Culture (routine x 2)     Status: None (Preliminary result)   Collection Time: 12/03/18  2:05 PM  Result Value Ref Range Status   Specimen Description BLOOD BLOOD RIGHT FOREARM  Final   Special Requests   Final    BOTTLES DRAWN AEROBIC AND ANAEROBIC Blood Culture adequate volume   Culture   Final    NO GROWTH 3 DAYS Performed at  [facility], [address] [place], [place] [address]    Report Status PENDING  Incomplete     Studies: Ct Abdomen Pelvis Wo Contrast  Result Date: 12/06/2018 CLINICAL DATA:  History of emphysematous cholecystitis EXAM: CT ABDOMEN AND PELVIS WITHOUT CONTRAST TECHNIQUE: Multidetector CT imaging of the abdomen and pelvis was performed following the standard protocol without IV contrast. COMPARISON:  12/03/2018 FINDINGS: Lower chest: Lung bases demonstrate some minimal pleural fluid bilaterally new from the prior exam. Hepatobiliary: The liver is stable. Gallbladder again demonstrates some air within consistent with emphysematous cholecystitis. Gallstone is noted within as well. The pneumobilia has resolved in the interval from the prior exam. Persistent inflammatory change surrounding the gallbladder and extending towards the hepatic flexure is noted. Some extraluminal air is again identified likely related to contained rupture. Pancreas: Unremarkable. No pancreatic ductal dilatation or surrounding inflammatory changes. Spleen: Normal in size without focal abnormality. Adrenals/Urinary Tract: Adrenal glands again demonstrate a stable left adrenal lesion. Increasing fullness of the collecting system on the right is noted with persistent lower pole stones. The fullness of the right ureter extends inferiorly to the level of the urinary bladder. No definitive distal right ureteral stone is noted. These changes may be in part due to the over distended bladder. Mild fullness on the left is noted as well also likely related to fullness of the bladder. Stomach/Bowel: Fecal material is again noted within the rectal vault which may represent some mild impaction. Scattered diverticular changes noted. Inflammatory change and wall thickening in the hepatic flexure is noted related to associated inflammatory change of the gallbladder. The overall appearance is stable. The remainder of the large and small bowel  are within normal limits. Previously seen lipoma in the duodenum is stable. Vascular/Lymphatic: Aortic atherosclerosis. No enlarged abdominal or pelvic lymph nodes. Mild ectasia of the abdominal aorta is again seen. Reproductive: Uterus and bilateral adnexa are unremarkable. Other: No abdominal wall hernia or abnormality. No abdominopelvic ascites. Musculoskeletal: Right hip replacement is noted. No acute bony abnormality is seen. IMPRESSION: Distention of the bladder with compensatory dilatation of the collecting systems bilaterally. Stable right renal calculi. Changes of emphysematous cholecystitis with surrounding inflammatory change and involvement of the adjacent hepatic flexure. The overall appearance is stable from the prior study. No new focal abnormality is noted. Electronically Signed   By: Inez Catalina M.D.   On: 12/06/2018 14:50   Dg Chest Port 1 View  Result Date: 12/06/2018 CLINICAL DATA:  Cough and congestion EXAM: PORTABLE CHEST 1 VIEW COMPARISON:  12/03/2018, 01/28/2017 FINDINGS: Cardiomegaly with aortic atherosclerosis. Streaky atelectasis at the left base. No pleural effusion or pneumothorax. IMPRESSION: No active disease.  Mild cardiomegaly with streaky atelectasis at the left base. Electronically Signed   By: Donavan Foil M.D.   On: 12/06/2018 20:05    Scheduled Meds: . budesonide (PULMICORT) nebulizer solution  0.5 mg Nebulization BID  . heparin  5,000 Units Subcutaneous Q8H  . ipratropium-albuterol  3 mL Nebulization Q6H  . pneumococcal 23 valent vaccine  0.5 mL Intramuscular Tomorrow-1000   Continuous Infusions: . piperacillin-tazobactam (ZOSYN)  IV 3.375 g (12/06/18 1041)    Assessment/Plan:  1. Wheeze likely in the upper airway after eating.  I ordered nebulizer treatments.  Lasix one-time dose was given just in case fluid overload but less likely at this point.  Chest x-ray does not show an aspiration.  Patient already on Zosyn which would cover any aspiration  episode. 2. Urinary retention.  After Foley catheter placed a liter came out. 3. Acute kidney injury.  Should improve a little bit after Foley placement.  Continue to monitor closely. 4. Sepsis gallbladder rupture and cholecystitis.  Not a surgical candidate as per general surgery.  Medical management with IV antibiotics.  Code Status:     Code Status Orders  (From admission, onward)         Start     Ordered   12/03/18 1836  Do not attempt resuscitation (DNR)  Continuous    Question Answer Comment  In the event of cardiac or respiratory ARREST Do not call a "code blue"   In the event of cardiac or respiratory ARREST Do not perform Intubation, CPR, defibrillation or ACLS   In the event of cardiac or respiratory ARREST Use medication by any route, position, wound care, and other measures to relive pain and suffering. May use oxygen, suction and manual treatment of airway obstruction as needed for comfort.   Comments NMP      12/03/18 1838        Code Status History    Date Active Date Inactive Code Status Order ID Comments User Context   03/28/2016 0746 03/29/2016 1536 Full Code [id number]  Saundra Shelling, MD Inpatient     Family Communication: Spoke with family at the bedside Disposition Plan: To be determined  Consultants:  General surgery  Antibiotics:  Zosyn  Time spent: 45 minutes  [place]

## 2018-12-06 NOTE — Plan of Care (Signed)
  Problem: Fluid Volume: Goal: Hemodynamic stability will improve Outcome: Progressing   Problem: Clinical Measurements: Goal: Diagnostic test results will improve Outcome: Progressing Goal: Signs and symptoms of infection will decrease Outcome: Progressing   Problem: Respiratory: Goal: Ability to maintain adequate ventilation will improve Outcome: Progressing   Problem: Education: Goal: Knowledge of General Education information will improve Description Including pain rating scale, medication(s)/side effects and non-pharmacologic comfort measures Outcome: Progressing   Problem: Elimination: Goal: Will not experience complications related to bowel motility Outcome: Progressing Goal: Will not experience complications related to urinary retention Outcome: Progressing   Problem: Pain Managment: Goal: General experience of comfort will improve Outcome: Progressing   Problem: Safety: Goal: Ability to remain free from injury will improve Outcome: Progressing   Problem: Skin Integrity: Goal: Risk for impaired skin integrity will decrease Outcome: Progressing

## 2018-12-06 NOTE — Progress Notes (Signed)
RT called by RN to assess patient. Family stated that patient possibly aspirated while eating and drinking water. Patient was actively coughing and getting up moderate amount of thick clear to white phlegm. Patient was wet sounding and had wheezes. RN had placed patient on 2l [place] due to room air sats of 88%. Sats raised to mid 90's on [place]. MD called by RN with orders for lasix given to her. Dr Earleen Newport into see patient at this time.

## 2018-12-06 NOTE — Care Management Important Message (Signed)
Important Message  Patient Details  Name: Robin Morales MRN: [id number] Date of Birth: 1926-06-21   Medicare Important Message Given:  Yes    [name] A Fritzi Scripter 12/06/2018, 3:44 PM

## 2018-12-07 LAB — GLUCOSE, CAPILLARY: Glucose-Capillary: 254 mg/dL — ABNORMAL HIGH (ref 70–99)

## 2018-12-07 LAB — CBC
HCT: 28.2 % — ABNORMAL LOW (ref 36.0–46.0)
Hemoglobin: 9 g/dL — ABNORMAL LOW (ref 12.0–15.0)
MCH: 30.4 pg (ref 26.0–34.0)
MCHC: 31.9 g/dL (ref 30.0–36.0)
MCV: 95.3 fL (ref 80.0–100.0)
NRBC: 0 % (ref 0.0–0.2)
Platelets: 261 10*3/uL (ref 150–400)
RBC: 2.96 MIL/uL — ABNORMAL LOW (ref 3.87–5.11)
RDW: 13.2 % (ref 11.5–15.5)
WBC: 14 10*3/uL — ABNORMAL HIGH (ref 4.0–10.5)

## 2018-12-07 LAB — BASIC METABOLIC PANEL
Anion gap: 7 (ref 5–15)
BUN: 49 mg/dL — ABNORMAL HIGH (ref 8–23)
CO2: 23 mmol/L (ref 22–32)
CREATININE: 2.19 mg/dL — AB (ref 0.44–1.00)
Calcium: 7.8 mg/dL — ABNORMAL LOW (ref 8.9–10.3)
Chloride: 104 mmol/L (ref 98–111)
GFR calc Af Amer: 22 mL/min — ABNORMAL LOW (ref >60)
GFR calc non Af Amer: 19 mL/min — ABNORMAL LOW (ref >60)
Glucose, Bld: 208 mg/dL — ABNORMAL HIGH (ref 70–99)
Potassium: 4.6 mmol/L (ref 3.5–5.1)
Sodium: 134 mmol/L — ABNORMAL LOW (ref 135–145)

## 2018-12-07 MED ORDER — IPRATROPIUM-ALBUTEROL 0.5-2.5 (3) MG/3ML IN SOLN
3.0000 mL | Freq: Three times a day (TID) | RESPIRATORY_TRACT | Status: DC
  Administered 2018-12-07 – 2018-12-08 (×3): 3 mL via RESPIRATORY_TRACT
  Filled 2018-12-07 (×3): qty 3

## 2018-12-07 MED ORDER — RISPERIDONE 0.25 MG PO TABS
0.2500 mg | ORAL_TABLET | Freq: Every day | ORAL | Status: DC
  Administered 2018-12-07: 21:00:00 0.25 mg via ORAL
  Filled 2018-12-07 (×2): qty 1

## 2018-12-07 MED ORDER — PANTOPRAZOLE SODIUM 40 MG PO TBEC
40.0000 mg | DELAYED_RELEASE_TABLET | Freq: Every day | ORAL | Status: DC
  Administered 2018-12-07 – 2018-12-08 (×2): 40 mg via ORAL
  Filled 2018-12-07 (×2): qty 1

## 2018-12-07 MED ORDER — METOPROLOL TARTRATE 25 MG PO TABS
25.0000 mg | ORAL_TABLET | Freq: Two times a day (BID) | ORAL | Status: DC
  Administered 2018-12-07 – 2018-12-08 (×3): 25 mg via ORAL
  Filled 2018-12-07 (×3): qty 1

## 2018-12-07 NOTE — Progress Notes (Signed)
[facility] NAME: Robin Morales    MR#:  [id number]  DATE OF BIRTH:  Apr 02, 1926  SUBJECTIVE:  CHIEF COMPLAINT:   Chief Complaint  Patient presents with  . Fatigue  . Nausea  . Diarrhea   -Feeling much better.  Pleasantly confused -Denies any pain.  Tolerating regular diet well  REVIEW OF SYSTEMS:  Review of Systems  Unable to perform ROS: Dementia    DRUG ALLERGIES:   Allergies  Allergen Reactions  . Gabapentin     Other reaction(s): Dizziness  . Keflex [Cephalexin] Itching    VITALS:  Blood pressure (!) 156/64, pulse 100, temperature 98 F (36.7 C), temperature source Oral, resp. rate 18, height 4\' 10"  (1.473 m), weight 76.9 kg, SpO2 99 %.  PHYSICAL EXAMINATION:  Physical Exam  GENERAL:  84 y.o.-year-old elderly patient lying in the bed with no acute distress.  EYES: Pupils equal, round, reactive to light and accommodation. No scleral icterus. Extraocular muscles intact.  HEENT: Head atraumatic, normocephalic. Oropharynx and nasopharynx clear.  NECK:  Supple, no jugular venous distention. No thyroid enlargement, no tenderness.  LUNGS: Normal breath sounds bilaterally, no wheezing, rales,rhonchi or crepitation. No use of accessory muscles of respiration.  Decreased bibasilar breath sounds CARDIOVASCULAR: S1, S2 normal. No rubs, or gallops.  3/6 systolic murmur is present ABDOMEN: Soft, nontender, nondistended. Bowel sounds present. No organomegaly or mass.  EXTREMITIES: No pedal edema, cyanosis, or clubbing.  NEUROLOGIC: Cranial nerves II through XII are intact. Muscle strength 5/5 in all extremities. Sensation intact. Gait not checked.  PSYCHIATRIC: The patient is alert and oriented x 3.  SKIN: No obvious rash, lesion, or ulcer.    LABORATORY PANEL:   CBC Recent Labs  Lab 12/05/18 0951  WBC 24.5*  HGB 9.2*  HCT 28.3*  PLT 230    ------------------------------------------------------------------------------------------------------------------  Chemistries  Recent Labs  Lab 12/03/18 1405  12/05/18 0951  NA 132*   < > 134*  K 5.4*   < > 3.8  CL 100   < > 107  CO2 22   < > 17*  GLUCOSE 238*   < > 113*  BUN 68*   < > 62*  CREATININE 2.78*   < > 2.47*  CALCIUM 8.4*   < > 6.9*  AST 25  --   --   ALT 13  --   --   ALKPHOS 81  --   --   BILITOT 1.1  --   --    < > = values in this interval not displayed.   ------------------------------------------------------------------------------------------------------------------  Cardiac Enzymes No results for input(s): TROPONINI in the last 168 hours. ------------------------------------------------------------------------------------------------------------------  RADIOLOGY:  Ct Abdomen Pelvis Wo Contrast  Result Date: 12/06/2018 CLINICAL DATA:  History of emphysematous cholecystitis EXAM: CT ABDOMEN AND PELVIS WITHOUT CONTRAST TECHNIQUE: Multidetector CT imaging of the abdomen and pelvis was performed following the standard protocol without IV contrast. COMPARISON:  12/03/2018 FINDINGS: Lower chest: Lung bases demonstrate some minimal pleural fluid bilaterally new from the prior exam. Hepatobiliary: The liver is stable. Gallbladder again demonstrates some air within consistent with emphysematous cholecystitis. Gallstone is noted within as well. The pneumobilia has resolved in the interval from the prior exam. Persistent inflammatory change surrounding the gallbladder and extending towards the hepatic flexure is noted. Some extraluminal air is again identified likely related to contained rupture. Pancreas: Unremarkable. No pancreatic ductal dilatation or surrounding inflammatory changes. Spleen: Normal in size without focal abnormality. Adrenals/Urinary Tract:  Adrenal glands again demonstrate a stable left adrenal lesion. Increasing fullness of the collecting system on the  right is noted with persistent lower pole stones. The fullness of the right ureter extends inferiorly to the level of the urinary bladder. No definitive distal right ureteral stone is noted. These changes may be in part due to the over distended bladder. Mild fullness on the left is noted as well also likely related to fullness of the bladder. Stomach/Bowel: Fecal material is again noted within the rectal vault which may represent some mild impaction. Scattered diverticular changes noted. Inflammatory change and wall thickening in the hepatic flexure is noted related to associated inflammatory change of the gallbladder. The overall appearance is stable. The remainder of the large and small bowel are within normal limits. Previously seen lipoma in the duodenum is stable. Vascular/Lymphatic: Aortic atherosclerosis. No enlarged abdominal or pelvic lymph nodes. Mild ectasia of the abdominal aorta is again seen. Reproductive: Uterus and bilateral adnexa are unremarkable. Other: No abdominal wall hernia or abnormality. No abdominopelvic ascites. Musculoskeletal: Right hip replacement is noted. No acute bony abnormality is seen. IMPRESSION: Distention of the bladder with compensatory dilatation of the collecting systems bilaterally. Stable right renal calculi. Changes of emphysematous cholecystitis with surrounding inflammatory change and involvement of the adjacent hepatic flexure. The overall appearance is stable from the prior study. No new focal abnormality is noted. Electronically Signed   By: Inez Catalina M.D.   On: 12/06/2018 14:50   Dg Chest Port 1 View  Result Date: 12/06/2018 CLINICAL DATA:  Cough and congestion EXAM: PORTABLE CHEST 1 VIEW COMPARISON:  12/03/2018, 01/28/2017 FINDINGS: Cardiomegaly with aortic atherosclerosis. Streaky atelectasis at the left base. No pleural effusion or pneumothorax. IMPRESSION: No active disease. Mild cardiomegaly with streaky atelectasis at the left base. Electronically  Signed   By: Donavan Foil M.D.   On: 12/06/2018 20:05    EKG:   Orders placed or performed during the hospital encounter of 12/03/18  . ED EKG 12-Lead  . ED EKG 12-Lead    ASSESSMENT AND PLAN:   84 year old female with past medical history significant for A. fib, history of CVA, hypertension and dementia presents from home secondary to sepsis  1.  Sepsis-secondary to ruptured gallbladder and cholecystitis -Repeat CT abdomen still confirming cholelithiasis and ruptured gallbladder -Follow-up labs from today. -Clinically improving.  No pain, tolerating diet well -Currently on Zosyn -Based on family's request, will have surgery consult today -Blood cultures are negative  2.  Choking episode-after eating yesterday. -Chest x-ray is clear.  Patient already on Zosyn -Monitor while eating.  If needed we will get speech consult  3.  A. fib and history of stroke-patient takes Eliquis at home, has been on hold since admission -We will start Lopressor if blood pressure is improving  4.  GERD-we will add PPI  5.  Dementia-pleasantly confused.  Restart risperidone twice daily  6.  DVT prophylaxis-subcutaneous heparin  7.  Acute renal failure-had urinary retention in the hospital.  Foley placed, repeat labs are pending   Physical Therapy consulted today   All the records are reviewed and case discussed with Care Management/Social Workerr. Management plans discussed with the patient, family and they are in agreement.  CODE STATUS: DNR  TOTAL TIME TAKING CARE OF THIS PATIENT: 37 minutes.   POSSIBLE D/C IN 1-2 DAYS, DEPENDING ON CLINICAL CONDITION.   Gladstone Lighter M.D on 12/07/2018 at 12:33 PM  Between 7am to 6pm - Pager - [phone]  After 6pm go to [email or web address] -  password [email or web address] [facility]  Office  [phone]  CC: Primary care physician; Baxter Hire, MD

## 2018-12-07 NOTE — Consult Note (Signed)
SURGICAL CONSULTATION NOTE   HISTORY OF PRESENT ILLNESS (HPI):  84 y.o. female admitted to the hospital with the diagnosis of perforated cholecystitis. The patient was evaluated 4 days ago by Dr. Dahlia Byes who addressed that the patient is not a good candidate for surgery due to her chronic medical comorbilities and the acute severe sepsis that the patient presented. The patient was treated with antibiotic therapy and has responded well. Today the patient refers feeling really well. Denies any type of abdominal pain. Family told me that she has been eating better. Denies nausea or vomiting.  As per family patient started with abdominal pain on Sunday 12/01/18 and came to the hospital on Tuesday 12/03/18. This is the first time that they know that the patient has been diagnosed with stone on her gallbladder. No previous cholecystitis or biliary colic. Patient is not good historian due to baseline dementia. History taken by two daughters and one granddaughter. Patient does not know why she is in the hospital but refers she feels good.  Surgery is consulted by Dr. Tressia Miners in this context for evaluation and management of second opinion on surgical management for emphysematous cholecystitis.  PAST MEDICAL HISTORY (PMH):  Past Medical History:  Diagnosis Date  . A-fib ([place]) 03/28/2016  . Arthritis   . CAD (coronary artery disease) 07/10/2014  . Chronic ischemic left PCA stroke 04/14/2016   Overview:  Left PCA  . CVA (cerebral vascular accident) ([place])   . Diabetes mellitus without complication ([place])   . Gastroesophageal reflux disease without esophagitis 07/05/2015  . Hypercholesterolemia   . Hyperlipidemia 07/10/2014  . Hypertension   . MI (myocardial infarction) ([place])   . TIA (transient ischemic attack) 03/28/2016  . Type 2 diabetes mellitus with diabetic nephropathy ([place]) 01/14/2016     PAST SURGICAL HISTORY ([place]):  Past Surgical History:  Procedure Laterality Date  . HIP ARTHROPLASTY        MEDICATIONS:  Prior to Admission medications   Medication Sig Start Date End Date Taking? Authorizing Provider  acetaminophen (TYLENOL) 500 MG tablet Take 500 mg by mouth every 6 (six) hours as needed for mild pain.    Yes [provider]  alendronate (FOSAMAX) 70 MG tablet Take 70 mg by mouth every Thursday.  03/12/18  Yes [provider]  apixaban (ELIQUIS) 2.5 MG TABS tablet Take 1 tablet (2.5 mg total) by mouth 2 (two) times daily. 03/29/16  Yes Max Sane, MD  atorvastatin (LIPITOR) 40 MG tablet Take 1 tablet (40 mg total) by mouth daily at 6 PM. Patient taking differently: Take 40 mg by mouth daily.  03/29/16  Yes Max Sane, MD  Chlorpheniramine-Acetaminophen (CORICIDIN HBP COLD/FLU PO) Take 1 tablet by mouth daily as needed (cold and flu symptoms).    Yes [provider]  cholecalciferol (VITAMIN D3) 25 MCG (1000 UT) tablet Take 1,000 Units by mouth daily.   Yes [provider]  Diphenhydramine-PE-APAP (DELSYM COUGH/COLD NIGHT TIME PO) Take 1 Dose by mouth daily as needed (cough/cold symptoms).    Yes [provider]  glipiZIDE (GLUCOTROL) 5 MG tablet Take 5 mg by mouth daily before breakfast.  03/12/18  Yes [provider]  hydrochlorothiazide (HYDRODIURIL) 25 MG tablet Take 25 mg by mouth daily.   Yes [provider]  meloxicam (MOBIC) 15 MG tablet Take 15 mg by mouth daily.  02/27/18  Yes [provider]  metoprolol (LOPRESSOR) 50 MG tablet Take 50 mg by mouth 2 (two) times daily.    Yes [provider]  omeprazole (PRILOSEC) 40 MG capsule Take 1 capsule by mouth daily. 03/18/16  Yes [provider]  risperiDONE (RISPERDAL) 0.25 MG tablet Take 0.25 mg by mouth 2 (two) times daily.   Yes [provider]  sodium bicarbonate 325 MG tablet Take 325 mg by mouth 4 (four) times daily.   Yes [provider]  vitamin B-12 (CYANOCOBALAMIN) 1000 MCG tablet Take 1,000 mcg by mouth daily.   Yes  [provider]     ALLERGIES:  Allergies  Allergen Reactions  . Gabapentin     Other reaction(s): Dizziness  . Keflex [Cephalexin] Itching     SOCIAL HISTORY:  Social History   Socioeconomic History  . Marital status: Married    Spouse name: Not on file  . Number of children: Not on file  . Years of education: Not on file  . Highest education level: Not on file  Occupational History  . Occupation: retired  Scientific laboratory technician  . Financial resource strain: Not on file  . Food insecurity:    Worry: Not on file    Inability: Not on file  . Transportation needs:    Medical: Not on file    Non-medical: Not on file  Tobacco Use  . Smoking status: Never Smoker  . Smokeless tobacco: Never Used  Substance and Sexual Activity  . Alcohol use: No  . Drug use: No  . Sexual activity: Not on file  Lifestyle  . Physical activity:    Days per week: Not on file    Minutes per session: Not on file  . Stress: Not on file  Relationships  . Social connections:    Talks on phone: Not on file    Gets together: Not on file    Attends religious service: Not on file    Active member of club or organization: Not on file    Attends meetings of clubs or organizations: Not on file    Relationship status: Not on file  . Intimate partner violence:    Fear of current or ex partner: Not on file    Emotionally abused: Not on file    Physically abused: Not on file    Forced sexual activity: Not on file  Other Topics Concern  . Not on file  Social History Narrative  . Not on file    FAMILY HISTORY:  Family History  Problem Relation Age of Onset  . Hypertension Daughter   . Diabetes Sister   . Prostate cancer Neg Hx      REVIEW OF SYSTEMS:  Constitutional: denies weight loss, fever, chills, or sweats  Eyes: denies any other vision changes, history of eye injury  ENT: denies sore throat, hearing problems  Respiratory: denies shortness of breath, wheezing  Cardiovascular: denies  chest pain, palpitations  Gastrointestinal: positive for abdominal pain, N/V, and diarrhea Genitourinary: denies burning with urination or urinary frequency Musculoskeletal: denies any other joint pains or cramps  Skin: denies any other rashes or skin discolorations  Neurological: denies any other headache, dizziness, weakness  Psychiatric: denies any other depression, anxiety   All other review of systems were negative   VITAL SIGNS:  Temp:  [98 F (36.7 C)] 98 F (36.7 C) (12/14 0508) Pulse Rate:  [94-102] 100 (12/14 0508) Resp:  [17-18] 18 (12/14 0508) BP: (110-179)/(58-75) 156/64 (12/14 0508) SpO2:  [95 %-99 %] 99 % (12/14 0844) FiO2 (%):  [28 %] 28 % (12/14 0226) Weight:  [76.9 kg] 76.9 kg (12/14 0508)  Height: 4\' 10"  (147.3 cm) Weight: 76.9 kg BMI (Calculated): 35.44   INTAKE/OUTPUT:  This shift: No intake/output data recorded.  Last 2 shifts: @IOLAST2SHIFTS @   PHYSICAL EXAM:  Constitutional:  -- Normal body habitus  -- Awake, alert, and oriented x2 (person and place)  Eyes:  -- Pupils equally round and reactive to light  -- No scleral icterus  Ear, nose, and throat:  -- No jugular venous distension  Pulmonary:  -- No crackles  -- Equal breath sounds bilaterally -- Breathing non-labored at rest Cardiovascular:  -- S1, S2 present  -- No pericardial rubs Gastrointestinal:  -- Abdomen soft, nontender, non-distended, no guarding or rebound tenderness -- No abdominal masses appreciated, pulsatile or otherwise  Musculoskeletal and Integumentary:  -- Wounds or skin discoloration: None appreciated -- Extremities: B/L UE and LE FROM, hands and feet warm, no edema  Neurologic:  -- Motor function: intact and symmetric -- Sensation: intact and symmetric   Labs:  CBC Latest Ref Rng & Units 12/07/2018 12/05/2018 12/04/2018  WBC 4.0 - 10.5 K/uL 14.0(H) 24.5(H) 24.7(H)  Hemoglobin 12.0 - 15.0 g/dL 9.0(L) 9.2(L) 9.6(L)  Hematocrit 36.0 - 46.0 % 28.2(L) 28.3(L) 29.8(L)   Platelets 150 - 400 K/uL 261 230 217   CMP Latest Ref Rng & Units 12/07/2018 12/05/2018 12/04/2018  Glucose 70 - 99 mg/dL 208(H) 113(H) 79  BUN 8 - 23 mg/dL 49(H) 62(H) 67(H)  Creatinine 0.44 - 1.00 mg/dL 2.19(H) 2.47(H) 2.77(H)  Sodium 135 - 145 mmol/L 134(L) 134(L) 137  Potassium 3.5 - 5.1 mmol/L 4.6 3.8 4.2  Chloride 98 - 111 mmol/L 104 107 107  CO2 22 - 32 mmol/L 23 17(L) 20(L)  Calcium 8.9 - 10.3 mg/dL 7.8(L) 6.9(L) 7.7(L)  Total Protein 6.5 - 8.1 g/dL - - -  Total Bilirubin 0.3 - 1.2 mg/dL - - -  Alkaline Phos 38 - 126 U/L - - -  AST 15 - 41 U/L - - -  ALT 0 - 44 U/L - - -    Imaging studies: (I personally reviewed and compared both CT scans) EXAM: CT ABDOMEN AND PELVIS WITHOUT CONTRAST  TECHNIQUE: Multidetector CT imaging of the abdomen and pelvis was performed following the standard protocol without IV contrast.  COMPARISON:  12/03/2018  FINDINGS: Lower chest: Lung bases demonstrate some minimal pleural fluid bilaterally new from the prior exam.  Hepatobiliary: The liver is stable. Gallbladder again demonstrates some air within consistent with emphysematous cholecystitis. Gallstone is noted within as well. The pneumobilia has resolved in the interval from the prior exam. Persistent inflammatory change surrounding the gallbladder and extending towards the hepatic flexure is noted. Some extraluminal air is again identified likely related to contained rupture.  Pancreas: Unremarkable. No pancreatic ductal dilatation or surrounding inflammatory changes.  Spleen: Normal in size without focal abnormality.  Adrenals/Urinary Tract: Adrenal glands again demonstrate a stable left adrenal lesion. Increasing fullness of the collecting system on the right is noted with persistent lower pole stones. The fullness of the right ureter extends inferiorly to the level of the urinary bladder. No definitive distal right ureteral stone is noted. These changes may be in  part due to the over distended bladder. Mild fullness on the left is noted as well also likely related to fullness of the bladder.  Stomach/Bowel: Fecal material is again noted within the rectal vault which may represent some mild impaction. Scattered diverticular changes noted. Inflammatory change and wall thickening in the hepatic flexure is noted related to associated inflammatory change of the gallbladder.  The overall appearance is stable. The remainder of the large and small bowel are within normal limits. Previously seen lipoma in the duodenum is stable.  Vascular/Lymphatic: Aortic atherosclerosis. No enlarged abdominal or pelvic lymph nodes. Mild ectasia of the abdominal aorta is again seen.  Reproductive: Uterus and bilateral adnexa are unremarkable.  Other: No abdominal wall hernia or abnormality. No abdominopelvic ascites.  Musculoskeletal: Right hip replacement is noted. No acute bony abnormality is seen.  IMPRESSION: Distention of the bladder with compensatory dilatation of the collecting systems bilaterally.  Stable right renal calculi.  Changes of emphysematous cholecystitis with surrounding inflammatory change and involvement of the adjacent hepatic flexure. The overall appearance is stable from the prior study.  No new focal abnormality is noted.   Electronically Signed   By: Inez Catalina M.D.   On: 12/06/2018 14:50 EXAM: CT ABDOMEN AND PELVIS WITHOUT CONTRAST  TECHNIQUE: Multidetector CT imaging of the abdomen and pelvis was performed following the standard protocol without IV contrast.  COMPARISON:  03/11/2017  FINDINGS: Lower chest: No acute abnormality.  Hepatobiliary: The liver demonstrates evidence of pneumobilia. The gallbladder is well distended and demonstrates what appears to be a large gallstone within. Additionally there is air within the wall of the gallbladder and small foci of air surrounding the  gallbladder consistent with emphysematous cholecystitis and likely contained perforation. Air is also noted within the common bile duct. This would correspond with the pneumobilia seen within the liver.  Pancreas: Fatty infiltration of the pancreas is noted. No focal mass is noted.  Spleen: Normal in size without focal abnormality.  Adrenals/Urinary Tract: Stable left adrenal lesion is noted measuring 2.1 cm. Right adrenal gland is unremarkable. The left kidney demonstrates no evidence of renal calculi or urinary tract obstructive changes. The right kidney is somewhat malrotated and demonstrates stones within the lower pole measuring 12 mm in greatest dimension. This has increased slightly in the interval from the prior exam at which time it measured 1 cm. Some small calcifications are noted along the margin of the right kidney which are stable in appearance from the prior exam. Bladder is partially distended.  Stomach/Bowel: Prominent fecal material is noted within the rectal vault which may represent a mild degree of rectal impaction. Diverticular changes noted without definitive diverticulitis. Some inflammatory changes are noted in the region of the hepatic flexure likely related to the associated gallbladder changes. The appendix is well seen and within normal limits. Small bowel shows no obstructive changes. A fatty lesion is noted within the second portion of the duodenum consistent with small lipoma. This is stable from the previous exam.  Vascular/Lymphatic: Diffuse aortic calcifications are noted. Mild ectasia is seen to 2.8 cm. No significant lymphadenopathy is identified.  Reproductive: Uterus and bilateral adnexa are unremarkable.  Other: No abdominal wall hernia or abnormality. No abdominopelvic ascites.  Musculoskeletal: Right hip replacement is noted. Degenerative changes of the lumbar spine are seen. No compression deformities  are noted.  IMPRESSION: Changes consistent with emphysematous cholecystitis with what appears to be a gallstone within the gallbladder. Associated pneumobilia is seen as well as some air adjacent to the gallbladder suggestive of contained rupture.  Changes of diverticulosis. No definitive diverticulitis is seen although some inflammatory changes in the hepatic flexure likely related to the gallbladder changes are noted.  Stable left adrenal lesion.  Lower pole right renal calculus with mild increase in size of 2 mm when compared with the prior exam.  Mild ectasia of the abdominal aorta.  Critical  Value/emergent results were called by telephone at the time of interpretation on 12/03/2018 at 4:10 pm to Dr. Rudene Re , who verbally acknowledged these results.   Electronically Signed   By: Inez Catalina M.D.   On: 12/03/2018 16:15   Assessment/Plan: 84 y.o. female with emphysematous cholecystitis, complicated by pertinent comorbidities including atrial fibrillation and stroke on anticoagulation, coronary artery disease, advance age and demential, diabetes mellitus, hyperlipidemia, hypertension, myocardial infarction, acute over chronic kidney injury. Patient initially evaluated by Dr. Dahlia Byes at ED and assessed that patient is poor surgical candidate and recommended IV antibiotics and percutaneous drainage. The patient responded to antibiotic therapy and is much better clinically. Family asking for surgical opinion as patient got better. I agree with Dr. Corlis Leak assessment that patient is not a surgical candidate. Specially in this admission, patient has been more than 5 days with symptoms and this is the worst moment for surgical management. Patient is in a window until 6 weeks post this event that surgery is more risky than conservative management. Even after 6 weeks patient might never be a candidate for surgery. I oriented the family that when patient is discharged from  the hospital, to let her be herself and be happy. It will be important to think what is best for her, having surgery in the future putting her in a risk for complication and poor quality of life and suffering than having recurrence of cholecystitis and given her abx and percutaneous drainage if needed.  I recommended the family to give the best quality of life to her and make her as comfortable as possible. If they still want to discuss surgical alternatives, they can follow with Dr. Dahlia Byes as outpatient.   Arnold Long, MD

## 2018-12-08 DIAGNOSIS — Z23 Encounter for immunization: Secondary | ICD-10-CM | POA: Diagnosis not present

## 2018-12-08 LAB — BASIC METABOLIC PANEL
ANION GAP: 8 (ref 5–15)
BUN: 45 mg/dL — AB (ref 8–23)
CO2: 24 mmol/L (ref 22–32)
Calcium: 8.3 mg/dL — ABNORMAL LOW (ref 8.9–10.3)
Chloride: 103 mmol/L (ref 98–111)
Creatinine, Ser: 2.04 mg/dL — ABNORMAL HIGH (ref 0.44–1.00)
GFR calc Af Amer: 24 mL/min — ABNORMAL LOW (ref >60)
GFR calc non Af Amer: 21 mL/min — ABNORMAL LOW (ref >60)
Glucose, Bld: 206 mg/dL — ABNORMAL HIGH (ref 70–99)
POTASSIUM: 5 mmol/L (ref 3.5–5.1)
Sodium: 135 mmol/L (ref 135–145)

## 2018-12-08 LAB — CBC
HCT: 29.4 % — ABNORMAL LOW (ref 36.0–46.0)
Hemoglobin: 9.2 g/dL — ABNORMAL LOW (ref 12.0–15.0)
MCH: 29.9 pg (ref 26.0–34.0)
MCHC: 31.3 g/dL (ref 30.0–36.0)
MCV: 95.5 fL (ref 80.0–100.0)
NRBC: 0 % (ref 0.0–0.2)
Platelets: 292 10*3/uL (ref 150–400)
RBC: 3.08 MIL/uL — ABNORMAL LOW (ref 3.87–5.11)
RDW: 13.2 % (ref 11.5–15.5)
WBC: 12.7 10*3/uL — ABNORMAL HIGH (ref 4.0–10.5)

## 2018-12-08 LAB — CULTURE, BLOOD (ROUTINE X 2)
CULTURE: NO GROWTH
Culture: NO GROWTH
Special Requests: ADEQUATE
Special Requests: ADEQUATE

## 2018-12-08 LAB — GLUCOSE, CAPILLARY
Glucose-Capillary: 163 mg/dL — ABNORMAL HIGH (ref 70–99)
Glucose-Capillary: 137 mg/dL — ABNORMAL HIGH (ref 70–99)

## 2018-12-08 MED ORDER — AMLODIPINE BESYLATE 2.5 MG PO TABS: 2.5000 mg | ORAL_TABLET | Freq: Every day | ORAL | 1 refills | Status: DC

## 2018-12-08 MED ORDER — AMOXICILLIN-POT CLAVULANATE 875-125 MG PO TABS: 1.0000 | ORAL_TABLET | Freq: Two times a day (BID) | ORAL | 0 refills | Status: AC

## 2018-12-08 MED ORDER — ISOSORBIDE MONONITRATE ER 30 MG PO TB24
15.0000 mg | ORAL_TABLET | Freq: Every day | ORAL | Status: DC
  Administered 2018-12-08 (×2): 15 mg via ORAL
  Filled 2018-12-08 (×2): qty 1

## 2018-12-08 MED ORDER — PATIROMER SORBITEX CALCIUM 8.4 G PO PACK
8.4000 g | PACK | Freq: Every day | ORAL | Status: DC
  Filled 2018-12-08: qty 1

## 2018-12-08 MED ORDER — GUAIFENESIN-DM 100-10 MG/5ML PO SYRP
5.0000 mL | ORAL_SOLUTION | ORAL | Status: DC | PRN
  Filled 2018-12-08: qty 5

## 2018-12-08 NOTE — Care Management Note (Signed)
Case Management Note  Patient Details  Name: Robin Morales MRN: [id number] Date of Birth: March 01, 1926  Subjective/Objective:    Patient to be discharged per MD order. Orders in place for home health services. Patient agreeable to home health. CMS Medicare.gov Compare Post Acute Care list reviewed with patient and multiple family members. They prefer [facility] as they have used them in the past. No DME is currently needed. Referral to [facility] [place] care. Family to assist with transport.  Merrily Pew Anhthu Perdew RN BSN RNCM [phone]                   Action/Plan:   Expected Discharge Date:                  Expected Discharge Plan:  [place]  In-House Referral:     Discharge planning Services  CM Consult  Post Acute Care Choice:  Home Health Choice offered to:  Patient, Adult Children, Sibling  DME Arranged:    DME Agency:     HH Arranged:  RN, PT, Nurse's Aide [place] Agency:  [place]  Status of Service:  Completed, signed off  If discussed at [place] of Stay Meetings, dates discussed:    Additional Comments:  Waylon Hershey A Harlan Ervine, RN 12/08/2018, 10:40 AM

## 2018-12-08 NOTE — Evaluation (Signed)
Physical Therapy Evaluation Patient Details Name: Robin Morales MRN: [id number] DOB: 04-01-1926 Today's Date: 12/08/2018   History of Present Illness  Robin Morales is a 84yo female who comes to [facility] on 12/10 p 2D diarrhea, decr PO, and weakness. Pt noted to have emphysematous pancreas, with eventual rupture while admitted, surgical consults recommending conservative management. PMH: DM2, HTN, HLD, AF, HLD, MI.   Clinical Impression  Pt admitted with above diagnosis. Pt currently with functional limitations due to the deficits listed below (see "PT Problem List"). Upon entry, pt in bed, 3 daughters arrive sequentially throughout session. The pt is awake and agreeable to participate, enthusiastic and jazzed about mobilizing with PT. The pt is alert and oriented x3, pleasant, conversational, and following simple commands consistently. Initially upon standing, pt has difficulty with hip strategy trunk righting in the sagittal plane, falling back to bed 2x, but eventually able to correct enough to progress activity to AMB. After AMB, pt is able to perform transfers at supervision level. AMB is slow with low functional capacity, new postural deviations which daughters reports are new, forward flexed and leaning to the Rt slightly. Functional mobility assessment demonstrates increased effort/time requirements, poor tolerance, and need for physical assistance, whereas the patient performed these at a higher level of independence PTA. DTR [name] lives c pt can provide 24/7 caregiver assistance. Empirically, the patient demonstrates increased risk of recurrent falls AEB gait speed <0.59m/s, forward reach <5", and multiple LOB demonstrated throughout session. Family reports PT in the home setting would be best as it is difficult fo rpt to mobilize in/out of home. Pt will benefit from skilled PT intervention to increase independence and safety with basic mobility in preparation for discharge to the venue listed  below.       Follow Up Recommendations Home health PT    Equipment Recommendations  None recommended by PT    Recommendations for Other Services       Precautions / Restrictions Precautions Precautions: Fall      Mobility  Bed Mobility Overal bed mobility: Needs Assistance Bed Mobility: Sit to Supine       Sit to supine: Supervision   General bed mobility comments: additional time/effort needed, but no trunk instability  Transfers Overall transfer level: Needs assistance   Transfers: Sit to/from Stand;Stand Pivot Transfers Sit to Stand: Min assist Stand pivot transfers: Supervision       General transfer comment: STS inititally requiring 3 attempts to correct balance sufficicent to remain standing, with continuted retropulsion but to a lesser degree. After gait trial, pt able to SPT at supervision with improved equilibirum and trunk proprioception.   Ambulation/Gait Ambulation/Gait assistance: Min guard Gait Distance (Feet): 75 Feet Assistive device: Rolling walker (2 wheeled)(youthRW ) Gait Pattern/deviations: Trunk flexed;Leaning posteriorly Gait velocity: 0.63m/s   General Gait Details: slow, weak, mildly unsteady, slight lean to the right. No LOB with turns.   Stairs            Wheelchair Mobility    Modified Rankin (Stroke Patients Only)       Balance Overall balance assessment: Needs assistance   Sitting balance-Leahy Scale: Good     Standing balance support: During functional activity;Bilateral upper extremity supported Standing balance-Leahy Scale: Fair                               Pertinent Vitals/Pain Pain Assessment: No/denies pain    Home Living Family/patient expects to be discharged  to:: Private residence Living Arrangements: Children(DTR sharon ) Available Help at Discharge: Family Type of Home: House Home Access: Stairs to enter Entrance Stairs-Rails: Right;Can reach Software engineer of  Steps: 1 Home Layout: One level Home Equipment: [place] - 4 wheels;Shower seat;Bedside commode;Wheelchair - manual      Prior Function Level of Independence: Needs assistance   Gait / Transfers Assistance Needed: Mod-I c rollator in home; 1 fall 2MA in middle of night for toiletting  ADL's / Homemaking Assistance Needed: independent with ADL         Hand Dominance        Extremity/Trunk Assessment                Communication      Cognition Arousal/Alertness: Awake/alert Behavior During Therapy: WFL for tasks assessed/performed Overall Cognitive Status: Within Functional Limits for tasks assessed(some mild baseline cognitive impairment )                                        General Comments      Exercises     Assessment/Plan    PT Assessment Patient needs continued PT services  PT Problem List Decreased activity tolerance;Decreased balance;Decreased mobility;Decreased strength       PT Treatment Interventions Therapeutic exercise;DME instruction;Gait training;Functional mobility training;Therapeutic activities;Stair training;Patient/family education;Balance training    PT Goals (Current goals can be found in the Care Plan section)  Acute Rehab PT Goals Patient Stated Goal: regain mobility as PTA  PT Goal Formulation: With patient Time For Goal Achievement: 12/22/18 Potential to Achieve Goals: Good    Frequency Min 2X/week   Barriers to discharge        Co-evaluation               AM-PAC PT "6 Clicks" Mobility  Outcome Measure Help needed turning from your back to your side while in a flat bed without using bedrails?: None Help needed moving from lying on your back to sitting on the side of a flat bed without using bedrails?: None Help needed moving to and from a bed to a chair (including a wheelchair)?: A Little Help needed standing up from a chair using your arms (e.g., wheelchair or bedside chair)?: A Little Help needed  to walk in hospital room?: A Little Help needed climbing 3-5 steps with a railing? : A Lot 6 Click Score: 19    End of Session Equipment Utilized During Treatment: Gait belt Activity Tolerance: Patient tolerated treatment well Patient left: in chair;with family/visitor present;with call bell/phone within reach Nurse Communication: Mobility status PT Visit Diagnosis: Unsteadiness on feet (R26.81);Other abnormalities of gait and mobility (R26.89);Muscle weakness (generalized) (M62.81)    Time: 2952-8413 PT Time Calculation (min) (ACUTE ONLY): 26 min   Charges:   PT Evaluation $PT Eval Moderate Complexity: 1 Mod PT Treatments $Therapeutic Activity: 8-22 mins        1:17 PM, 12/08/18 Etta Grandchild, PT, DPT Physical Therapist - [facility]  [phone] ([place])     Buccola,Allan C 12/08/2018, 1:12 PM

## 2018-12-09 NOTE — Discharge Summary (Signed)
[facility] [place] NAME: Robin Morales    MR#:  [id number]  DATE OF BIRTH:  Dec 11, 1926  DATE OF ADMISSION:  12/03/2018   ADMITTING PHYSICIAN: Epifanio Lesches, MD  DATE OF DISCHARGE: 12/08/2018  4:03 PM  PRIMARY CARE PHYSICIAN: Baxter Hire, MD   ADMISSION DIAGNOSIS:   Diarrhea of presumed infectious origin [R19.7] Acute emphysematous cholecystitis [K81.0] Hyperkalemia [E87.5] Acute kidney injury superimposed on chronic kidney disease ([place]) [N17.9, N18.9] Sepsis with acute renal failure without septic shock, due to unspecified organism, unspecified acute renal failure type ([place]) [A41.9, R65.20, N17.9]  DISCHARGE DIAGNOSIS:   Active Problems:   Sepsis ([place])   Pressure injury of skin   Goals of care, counseling/discussion   Advanced care planning/counseling discussion   SECONDARY DIAGNOSIS:   Past Medical History:  Diagnosis Date  . A-fib ([place]) 03/28/2016  . Arthritis   . CAD (coronary artery disease) 07/10/2014  . Chronic ischemic left PCA stroke 04/14/2016   Overview:  Left PCA  . CVA (cerebral vascular accident) ([place])   . Diabetes mellitus without complication ([place])   . Gastroesophageal reflux disease without esophagitis 07/05/2015  . Hypercholesterolemia   . Hyperlipidemia 07/10/2014  . Hypertension   . MI (myocardial infarction) ([place])   . TIA (transient ischemic attack) 03/28/2016  . Type 2 diabetes mellitus with diabetic nephropathy ([place]) 01/14/2016    HOSPITAL COURSE:   84 year old female with past medical history significant for A. fib, history of CVA, hypertension and dementia presents from home secondary to sepsis  1.  Sepsis-secondary to ruptured gallbladder and cholecystitis -Repeat CT abdomen confirming cholelithiasis and ruptured gallbladder- no worsening -Clinically improving.  No pain, tolerating diet well -WBC improved.  Received Zosyn while in the hospital -discharge on augmentin -Blood cultures  are negative -Appreciate surgical consult.  No surgery recommended at this time this hospitalization.  If patient is stable, outpatient follow-up for consideration of IR cholecystostomy or cholecystectomy  2.  A. fib and history of stroke-patient takes Eliquis at home -on Metoprolol  3.  GERD-Prilosec  4. Dementia-pleasantly confused.  Restart risperidone twice daily  5.  Hypertension-Norvasc and metoprolol  6.  Acute renal failure on CKD  Stage IV -had urinary retention in the hospital.  Required Foley.  Acute renal failure resolved and patient's creatinine back to baseline of 2. -Foley removed, patient voiding well.  Will be discharged home without Foley catheter   Physical Therapy consulted -needs home health at discharge Daughters updated at bedside.   DISCHARGE CONDITIONS:   Guarded  CONSULTS OBTAINED:   None  DRUG ALLERGIES:   Allergies  Allergen Reactions  . Gabapentin     Other reaction(s): Dizziness  . Keflex [Cephalexin] Itching   DISCHARGE MEDICATIONS:   Allergies as of 12/08/2018      Reactions   Gabapentin    Other reaction(s): Dizziness   Keflex [cephalexin] Itching      Medication List    STOP taking these medications   DELSYM COUGH/COLD NIGHT TIME PO   hydrochlorothiazide 25 MG tablet Commonly known as:  HYDRODIURIL   meloxicam 15 MG tablet Commonly known as:  MOBIC     TAKE these medications   acetaminophen 500 MG tablet Commonly known as:  TYLENOL Take 500 mg by mouth every 6 (six) hours as needed for mild pain.   alendronate 70 MG tablet Commonly known as:  FOSAMAX Take 70 mg by mouth every Thursday.   amLODipine 2.5 MG tablet Commonly known as:  NORVASC Take 1 tablet (2.5 mg total) by mouth daily.   amoxicillin-clavulanate 875-125 MG tablet Commonly known as:  AUGMENTIN Take 1 tablet by mouth every 12 (twelve) hours for 10 days.   apixaban 2.5 MG Tabs tablet Commonly known as:  ELIQUIS Take 1 tablet (2.5 mg total)  by mouth 2 (two) times daily.   atorvastatin 40 MG tablet Commonly known as:  LIPITOR Take 1 tablet (40 mg total) by mouth daily at 6 PM. What changed:  when to take this   cholecalciferol 25 MCG (1000 UT) tablet Commonly known as:  VITAMIN D3 Take 1,000 Units by mouth daily.   CORICIDIN HBP COLD/FLU PO Take 1 tablet by mouth daily as needed (cold and flu symptoms).   glipiZIDE 5 MG tablet Commonly known as:  GLUCOTROL Take 5 mg by mouth daily before breakfast.   metoprolol tartrate 50 MG tablet Commonly known as:  LOPRESSOR Take 50 mg by mouth 2 (two) times daily.   omeprazole 40 MG capsule Commonly known as:  PRILOSEC Take 1 capsule by mouth daily.   risperiDONE 0.25 MG tablet Commonly known as:  RISPERDAL Take 0.25 mg by mouth 2 (two) times daily.   sodium bicarbonate 325 MG tablet Take 325 mg by mouth 4 (four) times daily.   vitamin B-12 1000 MCG tablet Commonly known as:  CYANOCOBALAMIN Take 1,000 mcg by mouth daily.        DISCHARGE INSTRUCTIONS:   1.  PCP follow-up in 1 to 2 weeks 2.  Surgical follow-up in 1 to 2 weeks  DIET:   Cardiac diet  ACTIVITY:   Activity as tolerated  OXYGEN:   Home Oxygen: No.  Oxygen Delivery: room air  DISCHARGE LOCATION:   home   If you experience worsening of your admission symptoms, develop shortness of breath, life threatening emergency, suicidal or homicidal thoughts you must seek medical attention immediately by calling 911 or calling your MD immediately  if symptoms less severe.  You Must read complete instructions/literature along with all the possible adverse reactions/side effects for all the Medicines you take and that have been prescribed to you. Take any new Medicines after you have completely understood and accpet all the possible adverse reactions/side effects.   Please note  You were cared for by a hospitalist during your hospital stay. If you have any questions about your discharge medications or  the care you received while you were in the hospital after you are discharged, you can call the unit and asked to speak with the hospitalist on call if the hospitalist that took care of you is not available. Once you are discharged, your primary care physician will handle any further medical issues. Please note that NO REFILLS for any discharge medications will be authorized once you are discharged, as it is imperative that you return to your primary care physician (or establish a relationship with a primary care physician if you do not have one) for your aftercare needs so that they can reassess your need for medications and monitor your lab values.    On the day of Discharge:  VITAL SIGNS:   Blood pressure (!) 172/83, pulse 85, temperature 98 F (36.7 C), temperature source Oral, resp. rate 18, height 4\' 10"  (1.473 m), weight 76.9 kg, SpO2 97 %.  PHYSICAL EXAMINATION:    GENERAL:  84 y.o.-year-old elderly patient lying in the bed with no acute distress.  EYES: Pupils equal, round, reactive to light and accommodation. No scleral icterus. Extraocular muscles intact.  HEENT: Head atraumatic, normocephalic. Oropharynx and nasopharynx clear.  NECK:  Supple, no jugular venous distention. No thyroid enlargement, no tenderness.  LUNGS: Normal breath sounds bilaterally, no wheezing, rales,rhonchi or crepitation. No use of accessory muscles of respiration.  Decreased bibasilar breath sounds CARDIOVASCULAR: S1, S2 normal. No rubs, or gallops.  3/6 systolic murmur is present ABDOMEN: Soft, nontender, nondistended. Bowel sounds present. No organomegaly or mass.  EXTREMITIES: No pedal edema, cyanosis, or clubbing.  NEUROLOGIC: Cranial nerves II through XII are intact. Muscle strength 5/5 in all extremities. Sensation intact. Gait not checked.  PSYCHIATRIC: The patient is alert and oriented x 3.  SKIN: No obvious rash, lesion, or ulcer.   DATA REVIEW:   CBC Recent Labs  Lab 12/08/18 0429  WBC 12.7*   HGB 9.2*  HCT 29.4*  PLT 292    Chemistries  Recent Labs  Lab 12/03/18 1405  12/08/18 0429  NA 132*   < > 135  K 5.4*   < > 5.0  CL 100   < > 103  CO2 22   < > 24  GLUCOSE 238*   < > 206*  BUN 68*   < > 45*  CREATININE 2.78*   < > 2.04*  CALCIUM 8.4*   < > 8.3*  AST 25  --   --   ALT 13  --   --   ALKPHOS 81  --   --   BILITOT 1.1  --   --    < > = values in this interval not displayed.     Microbiology Results  Results for orders placed or performed during the hospital encounter of 12/03/18  Blood Culture (routine x 2)     Status: None   Collection Time: 12/03/18  2:05 PM  Result Value Ref Range Status   Specimen Description BLOOD LEFT ANTECUBITAL  Final   Special Requests   Final    BOTTLES DRAWN AEROBIC AND ANAEROBIC Blood Culture adequate volume   Culture   Final    NO GROWTH 5 DAYS Performed at [facility], [address] [place], [place] [address]    Report Status 12/08/2018 FINAL  Final  Blood Culture (routine x 2)     Status: None   Collection Time: 12/03/18  2:05 PM  Result Value Ref Range Status   Specimen Description BLOOD BLOOD RIGHT FOREARM  Final   Special Requests   Final    BOTTLES DRAWN AEROBIC AND ANAEROBIC Blood Culture adequate volume   Culture   Final    NO GROWTH 5 DAYS Performed at [facility], [address] [place], [place] [address]    Report Status 12/08/2018 FINAL  Final    RADIOLOGY:  No results found.   Management plans discussed with the patient, family and they are in agreement.  CODE STATUS:  Code Status History    Date Active Date Inactive Code Status Order ID Comments User Context   12/03/2018 1838 12/08/2018 1909 DNR [id number]  Epifanio Lesches, MD ED   03/28/2016 0746 03/29/2016 1536 Full Code [id number]  Saundra Shelling, MD Inpatient    Questions for Most Recent Historical Code Status (Order [id number])    Question Answer Comment   In the event of cardiac or respiratory ARREST Do not call a  "code blue"    In the event of cardiac or respiratory ARREST Do not perform Intubation, CPR, defibrillation or ACLS    In the event of cardiac or respiratory ARREST Use medication by  any route, position, wound care, and other measures to relive pain and suffering. May use oxygen, suction and manual treatment of airway obstruction as needed for comfort.    Comments NMP       TOTAL TIME TAKING CARE OF THIS PATIENT: 38 minutes.    Gladstone Lighter M.D on 12/09/2018 at 2:21 PM  Between 7am to 6pm - Pager - [phone]  After 6pm go to [email or web address] - Proofreader  [facility]  Office  [phone]  CC: Primary care physician; Baxter Hire, MD   Note: This dictation was prepared with Dragon dictation along with smaller phrase technology. Any transcriptional errors that result from this process are unintentional.

## 2018-12-20 DIAGNOSIS — F0390 Unspecified dementia without behavioral disturbance: Secondary | ICD-10-CM | POA: Diagnosis not present

## 2018-12-20 DIAGNOSIS — Z8673 Personal history of transient ischemic attack (TIA), and cerebral infarction without residual deficits: Secondary | ICD-10-CM | POA: Diagnosis not present

## 2018-12-20 DIAGNOSIS — K810 Acute cholecystitis: Secondary | ICD-10-CM | POA: Diagnosis not present

## 2018-12-20 DIAGNOSIS — E1140 Type 2 diabetes mellitus with diabetic neuropathy, unspecified: Secondary | ICD-10-CM | POA: Diagnosis not present

## 2018-12-20 DIAGNOSIS — I482 Chronic atrial fibrillation, unspecified: Secondary | ICD-10-CM | POA: Diagnosis not present

## 2018-12-20 DIAGNOSIS — I2510 Atherosclerotic heart disease of native coronary artery without angina pectoris: Secondary | ICD-10-CM | POA: Diagnosis not present

## 2018-12-20 DIAGNOSIS — I10 Essential (primary) hypertension: Secondary | ICD-10-CM | POA: Diagnosis not present

## 2018-12-20 DIAGNOSIS — K82A2 Perforation of gallbladder in cholecystitis: Secondary | ICD-10-CM | POA: Diagnosis not present

## 2018-12-20 DIAGNOSIS — K838 Other specified diseases of biliary tract: Secondary | ICD-10-CM | POA: Diagnosis not present

## 2018-12-24 DIAGNOSIS — I10 Essential (primary) hypertension: Secondary | ICD-10-CM | POA: Diagnosis not present

## 2018-12-24 DIAGNOSIS — K82A2 Perforation of gallbladder in cholecystitis: Secondary | ICD-10-CM | POA: Diagnosis not present

## 2018-12-24 DIAGNOSIS — Z8673 Personal history of transient ischemic attack (TIA), and cerebral infarction without residual deficits: Secondary | ICD-10-CM | POA: Diagnosis not present

## 2018-12-24 DIAGNOSIS — I482 Chronic atrial fibrillation, unspecified: Secondary | ICD-10-CM | POA: Diagnosis not present

## 2018-12-24 DIAGNOSIS — F0390 Unspecified dementia without behavioral disturbance: Secondary | ICD-10-CM | POA: Diagnosis not present

## 2018-12-24 DIAGNOSIS — I2510 Atherosclerotic heart disease of native coronary artery without angina pectoris: Secondary | ICD-10-CM | POA: Diagnosis not present

## 2018-12-24 DIAGNOSIS — K838 Other specified diseases of biliary tract: Secondary | ICD-10-CM | POA: Diagnosis not present

## 2018-12-24 DIAGNOSIS — E1140 Type 2 diabetes mellitus with diabetic neuropathy, unspecified: Secondary | ICD-10-CM | POA: Diagnosis not present

## 2018-12-24 DIAGNOSIS — K810 Acute cholecystitis: Secondary | ICD-10-CM | POA: Diagnosis not present

## 2018-12-26 DIAGNOSIS — I2510 Atherosclerotic heart disease of native coronary artery without angina pectoris: Secondary | ICD-10-CM | POA: Diagnosis not present

## 2018-12-26 DIAGNOSIS — E1140 Type 2 diabetes mellitus with diabetic neuropathy, unspecified: Secondary | ICD-10-CM | POA: Diagnosis not present

## 2018-12-26 DIAGNOSIS — K82A2 Perforation of gallbladder in cholecystitis: Secondary | ICD-10-CM | POA: Diagnosis not present

## 2018-12-26 DIAGNOSIS — K838 Other specified diseases of biliary tract: Secondary | ICD-10-CM | POA: Diagnosis not present

## 2018-12-26 DIAGNOSIS — I10 Essential (primary) hypertension: Secondary | ICD-10-CM | POA: Diagnosis not present

## 2018-12-26 DIAGNOSIS — F0390 Unspecified dementia without behavioral disturbance: Secondary | ICD-10-CM | POA: Diagnosis not present

## 2018-12-26 DIAGNOSIS — I482 Chronic atrial fibrillation, unspecified: Secondary | ICD-10-CM | POA: Diagnosis not present

## 2018-12-26 DIAGNOSIS — K810 Acute cholecystitis: Secondary | ICD-10-CM | POA: Diagnosis not present

## 2018-12-26 DIAGNOSIS — Z8673 Personal history of transient ischemic attack (TIA), and cerebral infarction without residual deficits: Secondary | ICD-10-CM | POA: Diagnosis not present

## 2018-12-30 DIAGNOSIS — F0390 Unspecified dementia without behavioral disturbance: Secondary | ICD-10-CM | POA: Diagnosis not present

## 2018-12-30 DIAGNOSIS — E1140 Type 2 diabetes mellitus with diabetic neuropathy, unspecified: Secondary | ICD-10-CM | POA: Diagnosis not present

## 2018-12-30 DIAGNOSIS — K838 Other specified diseases of biliary tract: Secondary | ICD-10-CM | POA: Diagnosis not present

## 2018-12-30 DIAGNOSIS — I10 Essential (primary) hypertension: Secondary | ICD-10-CM | POA: Diagnosis not present

## 2018-12-30 DIAGNOSIS — I482 Chronic atrial fibrillation, unspecified: Secondary | ICD-10-CM | POA: Diagnosis not present

## 2018-12-30 DIAGNOSIS — K82A2 Perforation of gallbladder in cholecystitis: Secondary | ICD-10-CM | POA: Diagnosis not present

## 2018-12-30 DIAGNOSIS — K810 Acute cholecystitis: Secondary | ICD-10-CM | POA: Diagnosis not present

## 2018-12-30 DIAGNOSIS — Z8673 Personal history of transient ischemic attack (TIA), and cerebral infarction without residual deficits: Secondary | ICD-10-CM | POA: Diagnosis not present

## 2018-12-30 DIAGNOSIS — I2510 Atherosclerotic heart disease of native coronary artery without angina pectoris: Secondary | ICD-10-CM | POA: Diagnosis not present

## 2019-01-01 DIAGNOSIS — H01003 Unspecified blepharitis right eye, unspecified eyelid: Secondary | ICD-10-CM | POA: Diagnosis not present

## 2019-01-03 DIAGNOSIS — K82A2 Perforation of gallbladder in cholecystitis: Secondary | ICD-10-CM | POA: Diagnosis not present

## 2019-01-03 DIAGNOSIS — K838 Other specified diseases of biliary tract: Secondary | ICD-10-CM | POA: Diagnosis not present

## 2019-01-03 DIAGNOSIS — I2510 Atherosclerotic heart disease of native coronary artery without angina pectoris: Secondary | ICD-10-CM | POA: Diagnosis not present

## 2019-01-03 DIAGNOSIS — F0390 Unspecified dementia without behavioral disturbance: Secondary | ICD-10-CM | POA: Diagnosis not present

## 2019-01-03 DIAGNOSIS — K810 Acute cholecystitis: Secondary | ICD-10-CM | POA: Diagnosis not present

## 2019-01-03 DIAGNOSIS — I482 Chronic atrial fibrillation, unspecified: Secondary | ICD-10-CM | POA: Diagnosis not present

## 2019-01-03 DIAGNOSIS — I10 Essential (primary) hypertension: Secondary | ICD-10-CM | POA: Diagnosis not present

## 2019-01-03 DIAGNOSIS — E1140 Type 2 diabetes mellitus with diabetic neuropathy, unspecified: Secondary | ICD-10-CM | POA: Diagnosis not present

## 2019-01-03 DIAGNOSIS — Z8673 Personal history of transient ischemic attack (TIA), and cerebral infarction without residual deficits: Secondary | ICD-10-CM | POA: Diagnosis not present

## 2019-01-14 DIAGNOSIS — K82A2 Perforation of gallbladder in cholecystitis: Secondary | ICD-10-CM | POA: Diagnosis not present

## 2019-01-14 DIAGNOSIS — I2510 Atherosclerotic heart disease of native coronary artery without angina pectoris: Secondary | ICD-10-CM | POA: Diagnosis not present

## 2019-01-14 DIAGNOSIS — K810 Acute cholecystitis: Secondary | ICD-10-CM | POA: Diagnosis not present

## 2019-01-14 DIAGNOSIS — I482 Chronic atrial fibrillation, unspecified: Secondary | ICD-10-CM | POA: Diagnosis not present

## 2019-01-14 DIAGNOSIS — F0390 Unspecified dementia without behavioral disturbance: Secondary | ICD-10-CM | POA: Diagnosis not present

## 2019-01-14 DIAGNOSIS — E1140 Type 2 diabetes mellitus with diabetic neuropathy, unspecified: Secondary | ICD-10-CM | POA: Diagnosis not present

## 2019-01-14 DIAGNOSIS — I10 Essential (primary) hypertension: Secondary | ICD-10-CM | POA: Diagnosis not present

## 2019-01-14 DIAGNOSIS — Z8673 Personal history of transient ischemic attack (TIA), and cerebral infarction without residual deficits: Secondary | ICD-10-CM | POA: Diagnosis not present

## 2019-01-29 DIAGNOSIS — E1121 Type 2 diabetes mellitus with diabetic nephropathy: Secondary | ICD-10-CM | POA: Diagnosis not present

## 2019-01-29 DIAGNOSIS — I10 Essential (primary) hypertension: Secondary | ICD-10-CM | POA: Diagnosis not present

## 2019-02-05 DIAGNOSIS — I2510 Atherosclerotic heart disease of native coronary artery without angina pectoris: Secondary | ICD-10-CM | POA: Diagnosis not present

## 2019-02-05 DIAGNOSIS — E1121 Type 2 diabetes mellitus with diabetic nephropathy: Secondary | ICD-10-CM | POA: Diagnosis not present

## 2019-02-05 DIAGNOSIS — E1340 Other specified diabetes mellitus with diabetic neuropathy, unspecified: Secondary | ICD-10-CM | POA: Diagnosis not present

## 2019-02-05 DIAGNOSIS — Z0000 Encounter for general adult medical examination without abnormal findings: Secondary | ICD-10-CM | POA: Diagnosis not present

## 2019-02-05 DIAGNOSIS — E7800 Pure hypercholesterolemia, unspecified: Secondary | ICD-10-CM | POA: Diagnosis not present

## 2019-02-05 DIAGNOSIS — R300 Dysuria: Secondary | ICD-10-CM | POA: Diagnosis not present

## 2019-02-05 DIAGNOSIS — I4891 Unspecified atrial fibrillation: Secondary | ICD-10-CM | POA: Diagnosis not present

## 2019-02-05 DIAGNOSIS — I10 Essential (primary) hypertension: Secondary | ICD-10-CM | POA: Diagnosis not present

## 2019-02-17 DIAGNOSIS — H35372 Puckering of macula, left eye: Secondary | ICD-10-CM | POA: Diagnosis not present

## 2019-02-17 DIAGNOSIS — E119 Type 2 diabetes mellitus without complications: Secondary | ICD-10-CM | POA: Diagnosis not present

## 2019-06-06 DIAGNOSIS — E1121 Type 2 diabetes mellitus with diabetic nephropathy: Secondary | ICD-10-CM | POA: Diagnosis not present

## 2019-06-13 DIAGNOSIS — E1121 Type 2 diabetes mellitus with diabetic nephropathy: Secondary | ICD-10-CM | POA: Diagnosis not present

## 2019-06-13 DIAGNOSIS — I2510 Atherosclerotic heart disease of native coronary artery without angina pectoris: Secondary | ICD-10-CM | POA: Diagnosis not present

## 2019-06-13 DIAGNOSIS — I10 Essential (primary) hypertension: Secondary | ICD-10-CM | POA: Diagnosis not present

## 2019-06-13 DIAGNOSIS — Z0001 Encounter for general adult medical examination with abnormal findings: Secondary | ICD-10-CM | POA: Diagnosis not present

## 2019-06-13 DIAGNOSIS — I4891 Unspecified atrial fibrillation: Secondary | ICD-10-CM | POA: Diagnosis not present

## 2019-06-13 DIAGNOSIS — E7800 Pure hypercholesterolemia, unspecified: Secondary | ICD-10-CM | POA: Diagnosis not present

## 2019-07-02 IMAGING — CT CT ABD-PELV W/O CM
2 of 4 series · 15 of 46 positions shown, 17 images · non-contrast
Comparison: 03/11/2017

CLINICAL DATA: Weakness and abdominal pain

EXAM:
CT ABDOMEN AND PELVIS WITHOUT CONTRAST
TECHNIQUE: Multidetector CT imaging of the abdomen and pelvis was performed
following the standard protocol without IV contrast.

[Series 2: axial st · axial · 0.77mm/px · z∈[-444,-39]mm · 12 of 89 slices shown, 14 images]
[im 4/89  soft-tissue]
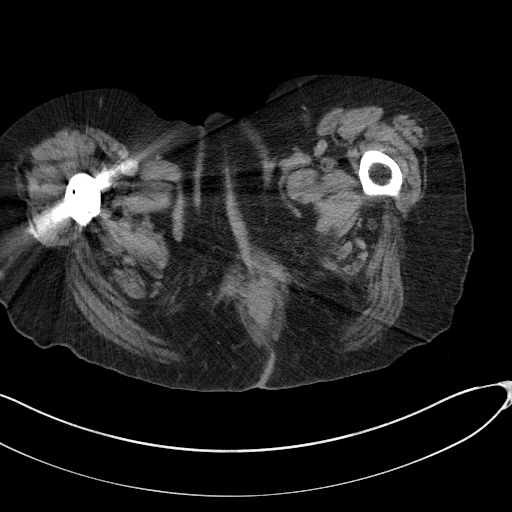
[im 4/89  bone]
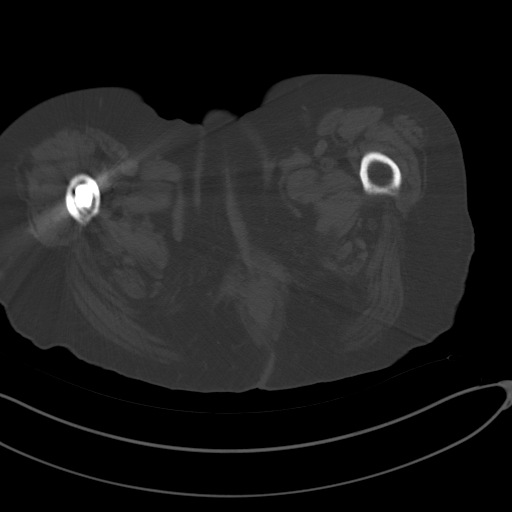
[im 12/89  soft-tissue]
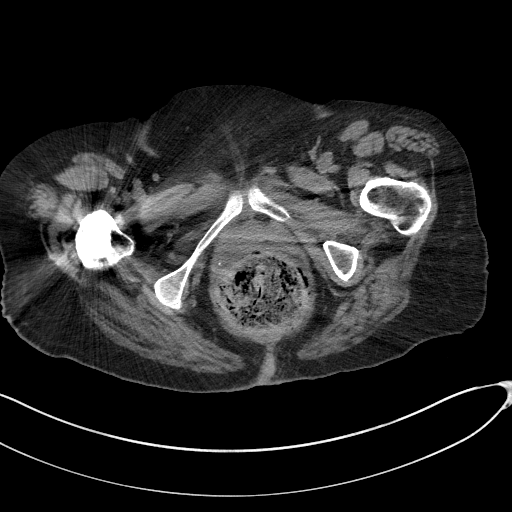
[im 19/89  soft-tissue]
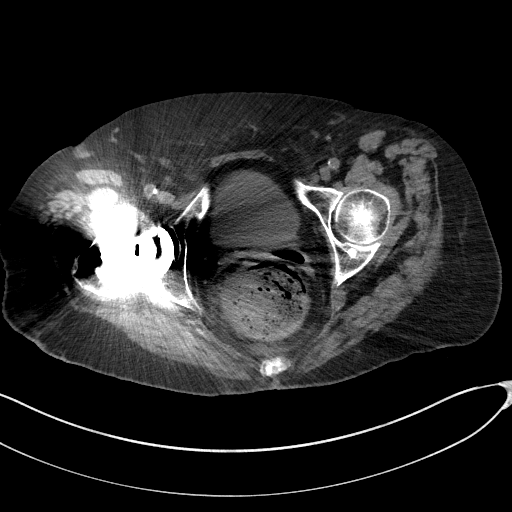
[im 26/89  soft-tissue]
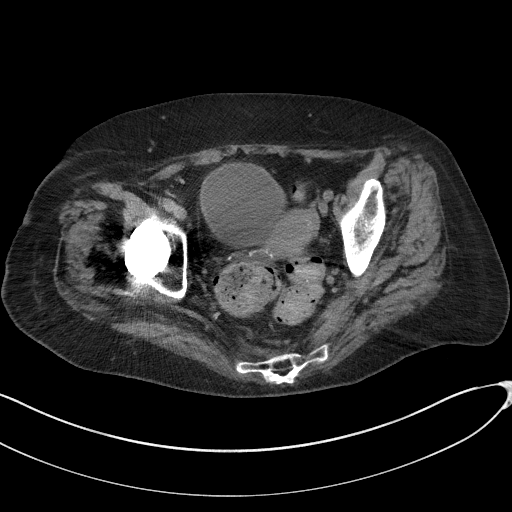
[im 34/89  soft-tissue]
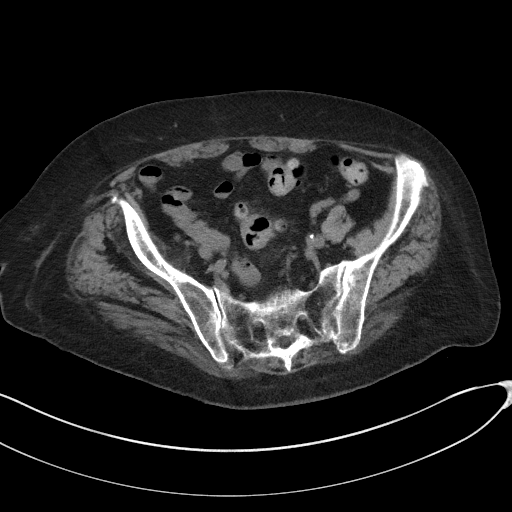
[im 41/89  soft-tissue]
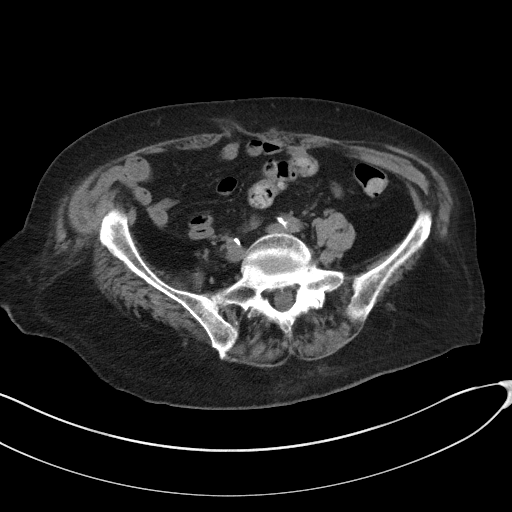
[im 48/89  soft-tissue]
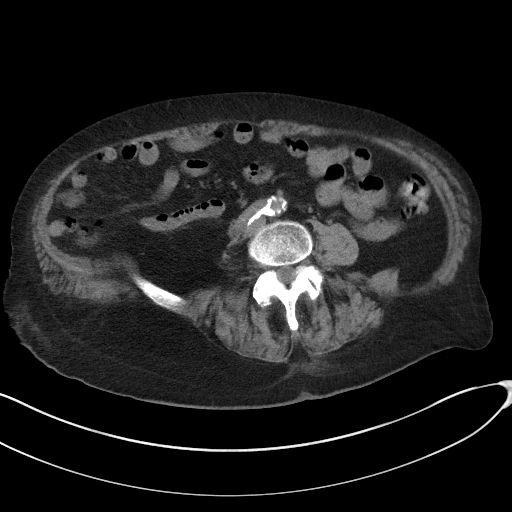
[im 56/89  soft-tissue]
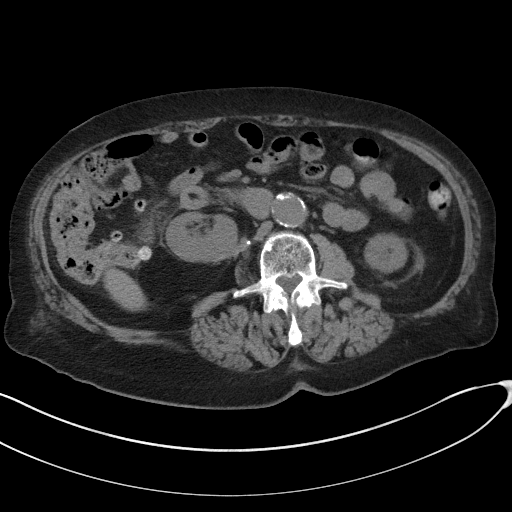
[im 63/89  soft-tissue]
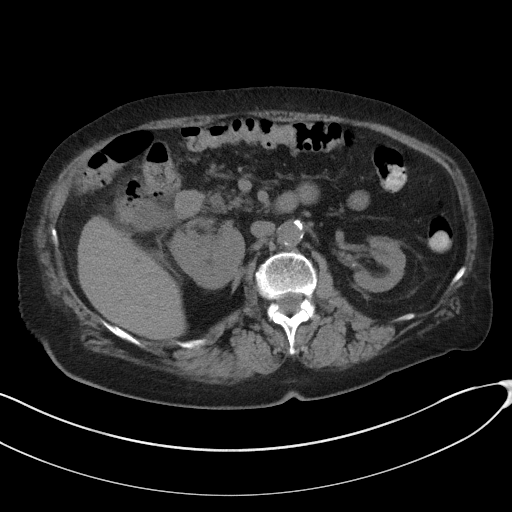
[im 63/89  bone]
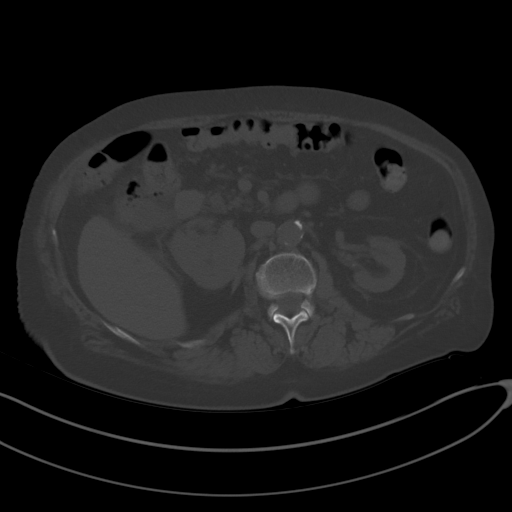
[im 70/89  soft-tissue]
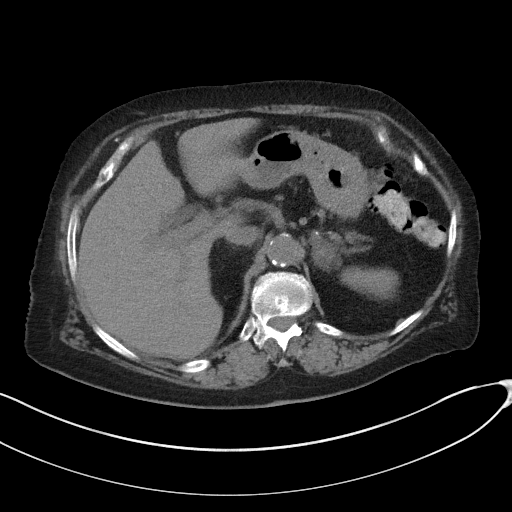
[im 78/89  soft-tissue]
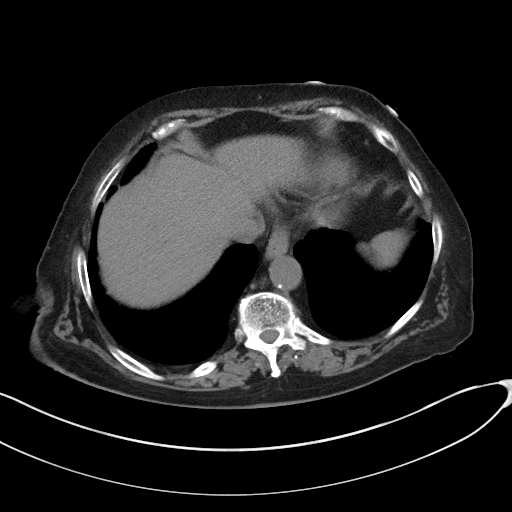
[im 85/89  soft-tissue]
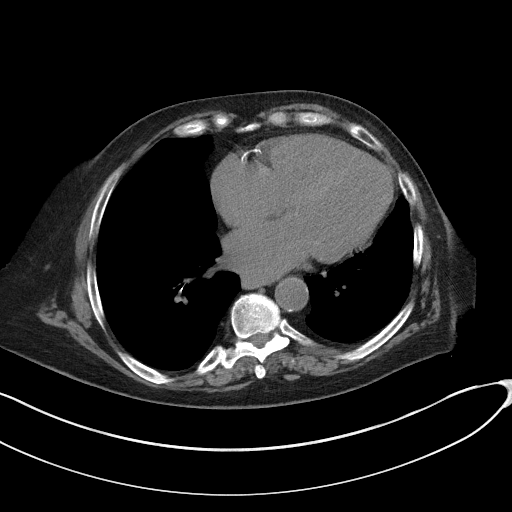

[Series 5: coronal st · coronal · 0.84mm/px · 3 of 83 slices shown]
[im 28/83  soft-tissue]
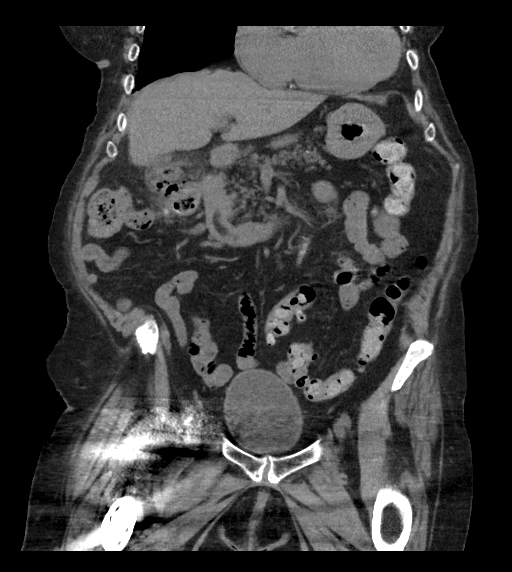
[im 37/83  soft-tissue]
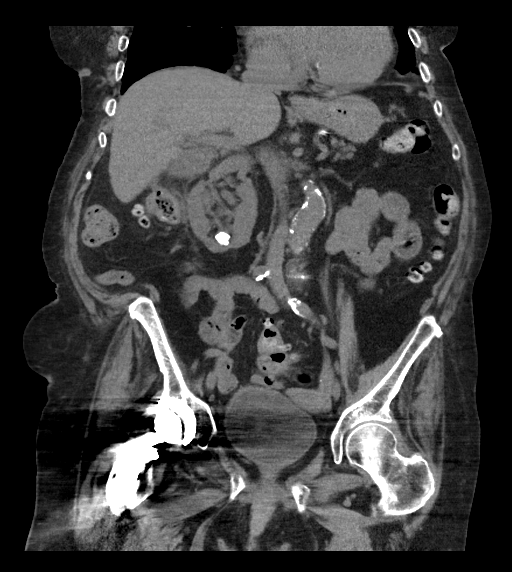
[im 46/83  soft-tissue]
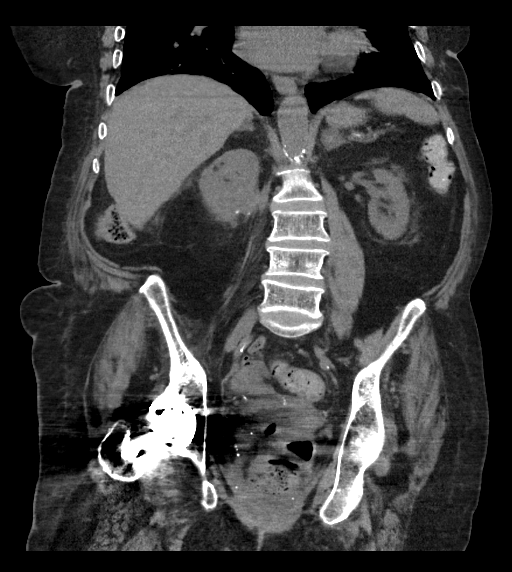

[15 of 46 positions shown; findings below may reference images not displayed]

FINDINGS: Lower chest: No acute abnormality.

Hepatobiliary: The liver demonstrates evidence of pneumobilia. The
gallbladder is well distended and demonstrates what appears to be a
large gallstone within. Additionally there is air within the wall of
the gallbladder and small foci of air surrounding the gallbladder
consistent with emphysematous cholecystitis and likely contained
perforation. Air is also noted within the common bile duct. This
would correspond with the pneumobilia seen within the liver.

Pancreas: Fatty infiltration of the pancreas is noted. No focal mass
is noted.

Spleen: Normal in size without focal abnormality.

Adrenals/Urinary Tract: Stable left adrenal lesion is noted
measuring 2.1 cm. Right adrenal gland is unremarkable. The left
kidney demonstrates no evidence of renal calculi or urinary tract
obstructive changes. The right kidney is somewhat malrotated and
demonstrates stones within the lower pole measuring 12 mm in
greatest dimension. This has increased slightly in the interval from
the prior exam at which time it measured 1 cm. Some small
calcifications are noted along the margin of the right kidney which
are stable in appearance from the prior exam. Bladder is partially
distended.

Stomach/Bowel: Prominent fecal material is noted within the rectal
vault which may represent a mild degree of rectal impaction.
Diverticular changes noted without definitive diverticulitis. Some
inflammatory changes are noted in the region of the hepatic flexure
likely related to the associated gallbladder changes. The appendix
is well seen and within normal limits. Small bowel shows no
obstructive changes. A fatty lesion is noted within the second
portion of the duodenum consistent with small lipoma. This is stable
from the previous exam.

Vascular/Lymphatic: Diffuse aortic calcifications are noted. Mild
ectasia is seen to 2.8 cm. No significant lymphadenopathy is
identified.

Reproductive: Uterus and bilateral adnexa are unremarkable.

Other: No abdominal wall hernia or abnormality. No abdominopelvic
ascites.

Musculoskeletal: Right hip replacement is noted. Degenerative
changes of the lumbar spine are seen. No compression deformities are
noted.
IMPRESSION: Changes consistent with emphysematous cholecystitis with what
appears to be a gallstone within the gallbladder. Associated
pneumobilia is seen as well as some air adjacent to the gallbladder
suggestive of contained rupture.

Changes of diverticulosis. No definitive diverticulitis is seen
although some inflammatory changes in the hepatic flexure likely
related to the gallbladder changes are noted.

Stable left adrenal lesion.

Lower pole right renal calculus with mild increase in size of 2 mm
when compared with the prior exam.

Mild ectasia of the abdominal aorta.

Critical Value/emergent results were called by telephone at the time
of interpretation on 12/03/2018 at [DATE] to Dr. [HOSPITAL] SAMBRANA
, who verbally acknowledged these results.

## 2019-08-14 DIAGNOSIS — R300 Dysuria: Secondary | ICD-10-CM | POA: Diagnosis not present

## 2019-10-06 DIAGNOSIS — E1121 Type 2 diabetes mellitus with diabetic nephropathy: Secondary | ICD-10-CM | POA: Diagnosis not present

## 2019-10-13 DIAGNOSIS — I10 Essential (primary) hypertension: Secondary | ICD-10-CM | POA: Diagnosis not present

## 2019-10-13 DIAGNOSIS — E785 Hyperlipidemia, unspecified: Secondary | ICD-10-CM | POA: Diagnosis not present

## 2019-10-13 DIAGNOSIS — I2510 Atherosclerotic heart disease of native coronary artery without angina pectoris: Secondary | ICD-10-CM | POA: Diagnosis not present

## 2019-10-13 DIAGNOSIS — Z23 Encounter for immunization: Secondary | ICD-10-CM | POA: Diagnosis not present

## 2019-10-13 DIAGNOSIS — I4891 Unspecified atrial fibrillation: Secondary | ICD-10-CM | POA: Diagnosis not present

## 2019-10-13 DIAGNOSIS — F0281 Dementia in other diseases classified elsewhere with behavioral disturbance: Secondary | ICD-10-CM | POA: Diagnosis not present

## 2019-10-13 DIAGNOSIS — E1121 Type 2 diabetes mellitus with diabetic nephropathy: Secondary | ICD-10-CM | POA: Diagnosis not present

## 2019-10-13 DIAGNOSIS — G301 Alzheimer's disease with late onset: Secondary | ICD-10-CM | POA: Diagnosis not present

## 2020-01-26 DIAGNOSIS — R399 Unspecified symptoms and signs involving the genitourinary system: Secondary | ICD-10-CM | POA: Diagnosis not present

## 2020-02-09 DIAGNOSIS — E1121 Type 2 diabetes mellitus with diabetic nephropathy: Secondary | ICD-10-CM | POA: Diagnosis not present

## 2020-02-16 DIAGNOSIS — E1121 Type 2 diabetes mellitus with diabetic nephropathy: Secondary | ICD-10-CM | POA: Diagnosis not present

## 2020-02-16 DIAGNOSIS — I4891 Unspecified atrial fibrillation: Secondary | ICD-10-CM | POA: Diagnosis not present

## 2020-02-16 DIAGNOSIS — I10 Essential (primary) hypertension: Secondary | ICD-10-CM | POA: Diagnosis not present

## 2020-02-16 DIAGNOSIS — G301 Alzheimer's disease with late onset: Secondary | ICD-10-CM | POA: Diagnosis not present

## 2020-02-16 DIAGNOSIS — E7800 Pure hypercholesterolemia, unspecified: Secondary | ICD-10-CM | POA: Diagnosis not present

## 2020-02-16 DIAGNOSIS — Z0000 Encounter for general adult medical examination without abnormal findings: Secondary | ICD-10-CM | POA: Diagnosis not present

## 2020-02-16 DIAGNOSIS — F0281 Dementia in other diseases classified elsewhere with behavioral disturbance: Secondary | ICD-10-CM | POA: Diagnosis not present

## 2020-02-19 DIAGNOSIS — E119 Type 2 diabetes mellitus without complications: Secondary | ICD-10-CM | POA: Diagnosis not present

## 2020-04-08 DIAGNOSIS — R399 Unspecified symptoms and signs involving the genitourinary system: Secondary | ICD-10-CM | POA: Diagnosis not present

## 2020-04-10 ENCOUNTER — Other Ambulatory Visit: Payer: Self-pay

## 2020-04-10 ENCOUNTER — Emergency Department: Payer: Medicare HMO

## 2020-04-10 ENCOUNTER — Observation Stay
Admission: EM | Admit: 2020-04-10 | Discharge: 2020-04-11 | Disposition: A | Discharge status: Home / Self Care | Payer: Medicare HMO | Attending: Hospitalist | Admitting: Hospitalist

## 2020-04-10 DIAGNOSIS — K219 Gastro-esophageal reflux disease without esophagitis: Secondary | ICD-10-CM | POA: Insufficient documentation

## 2020-04-10 DIAGNOSIS — S0990XA Unspecified injury of head, initial encounter: Secondary | ICD-10-CM | POA: Diagnosis not present

## 2020-04-10 DIAGNOSIS — E785 Hyperlipidemia, unspecified: Secondary | ICD-10-CM | POA: Diagnosis not present

## 2020-04-10 DIAGNOSIS — Z7984 Long term (current) use of oral hypoglycemic drugs: Secondary | ICD-10-CM | POA: Diagnosis not present

## 2020-04-10 DIAGNOSIS — E1122 Type 2 diabetes mellitus with diabetic chronic kidney disease: Secondary | ICD-10-CM | POA: Insufficient documentation

## 2020-04-10 DIAGNOSIS — Z20822 Contact with and (suspected) exposure to covid-19: Secondary | ICD-10-CM | POA: Insufficient documentation

## 2020-04-10 DIAGNOSIS — I129 Hypertensive chronic kidney disease with stage 1 through stage 4 chronic kidney disease, or unspecified chronic kidney disease: Secondary | ICD-10-CM | POA: Insufficient documentation

## 2020-04-10 DIAGNOSIS — I6782 Cerebral ischemia: Secondary | ICD-10-CM | POA: Diagnosis not present

## 2020-04-10 DIAGNOSIS — I2510 Atherosclerotic heart disease of native coronary artery without angina pectoris: Secondary | ICD-10-CM | POA: Insufficient documentation

## 2020-04-10 DIAGNOSIS — W19XXXA Unspecified fall, initial encounter: Secondary | ICD-10-CM | POA: Insufficient documentation

## 2020-04-10 DIAGNOSIS — Z7901 Long term (current) use of anticoagulants: Secondary | ICD-10-CM | POA: Diagnosis not present

## 2020-04-10 DIAGNOSIS — I10 Essential (primary) hypertension: Secondary | ICD-10-CM | POA: Diagnosis present

## 2020-04-10 DIAGNOSIS — Z8673 Personal history of transient ischemic attack (TIA), and cerebral infarction without residual deficits: Secondary | ICD-10-CM | POA: Insufficient documentation

## 2020-04-10 DIAGNOSIS — I252 Old myocardial infarction: Secondary | ICD-10-CM | POA: Diagnosis not present

## 2020-04-10 DIAGNOSIS — T7840XA Allergy, unspecified, initial encounter: Secondary | ICD-10-CM | POA: Diagnosis not present

## 2020-04-10 DIAGNOSIS — R4781 Slurred speech: Secondary | ICD-10-CM | POA: Diagnosis not present

## 2020-04-10 DIAGNOSIS — M1990 Unspecified osteoarthritis, unspecified site: Secondary | ICD-10-CM | POA: Diagnosis not present

## 2020-04-10 DIAGNOSIS — E11649 Type 2 diabetes mellitus with hypoglycemia without coma: Secondary | ICD-10-CM | POA: Diagnosis not present

## 2020-04-10 DIAGNOSIS — N184 Chronic kidney disease, stage 4 (severe): Secondary | ICD-10-CM | POA: Insufficient documentation

## 2020-04-10 DIAGNOSIS — Z79899 Other long term (current) drug therapy: Secondary | ICD-10-CM | POA: Diagnosis not present

## 2020-04-10 DIAGNOSIS — E161 Other hypoglycemia: Secondary | ICD-10-CM | POA: Diagnosis not present

## 2020-04-10 DIAGNOSIS — E162 Hypoglycemia, unspecified: Secondary | ICD-10-CM | POA: Diagnosis not present

## 2020-04-10 DIAGNOSIS — E7800 Pure hypercholesterolemia, unspecified: Secondary | ICD-10-CM | POA: Diagnosis not present

## 2020-04-10 DIAGNOSIS — I4891 Unspecified atrial fibrillation: Secondary | ICD-10-CM | POA: Diagnosis not present

## 2020-04-10 DIAGNOSIS — R0902 Hypoxemia: Secondary | ICD-10-CM | POA: Diagnosis not present

## 2020-04-10 LAB — URINALYSIS, COMPLETE (UACMP) WITH MICROSCOPIC
Bilirubin Urine: NEGATIVE
Glucose, UA: 50 mg/dL — AB
Ketones, ur: NEGATIVE mg/dL
Leukocytes,Ua: NEGATIVE
Nitrite: NEGATIVE
Protein, ur: 30 mg/dL — AB
RBC / HPF: >50 RBC/hpf — ABNORMAL HIGH (ref 0–5)
Specific Gravity, Urine: 1.014 (ref 1.005–1.030)
Squamous Epithelial / HPF: NONE SEEN (ref 0–5)
pH: 6 (ref 5.0–8.0)

## 2020-04-10 LAB — GLUCOSE, CAPILLARY
Glucose-Capillary: 201 mg/dL — ABNORMAL HIGH (ref 70–99)
Glucose-Capillary: 140 mg/dL — ABNORMAL HIGH (ref 70–99)

## 2020-04-10 LAB — COMPREHENSIVE METABOLIC PANEL
ALT: 11 U/L (ref 0–44)
AST: 13 U/L — ABNORMAL LOW (ref 15–41)
Albumin: 3.2 g/dL — ABNORMAL LOW (ref 3.5–5.0)
Alkaline Phosphatase: 68 U/L (ref 38–126)
Anion gap: 9 (ref 5–15)
BUN: 28 mg/dL — ABNORMAL HIGH (ref 8–23)
CO2: 25 mmol/L (ref 22–32)
Calcium: 8.3 mg/dL — ABNORMAL LOW (ref 8.9–10.3)
Chloride: 104 mmol/L (ref 98–111)
Creatinine, Ser: 1.86 mg/dL — ABNORMAL HIGH (ref 0.44–1.00)
GFR calc Af Amer: 27 mL/min — ABNORMAL LOW (ref >60)
GFR calc non Af Amer: 23 mL/min — ABNORMAL LOW (ref >60)
Glucose, Bld: 46 mg/dL — ABNORMAL LOW (ref 70–99)
Potassium: 3.5 mmol/L (ref 3.5–5.1)
Sodium: 138 mmol/L (ref 135–145)
Total Bilirubin: 0.6 mg/dL (ref 0.3–1.2)
Total Protein: 6.2 g/dL — ABNORMAL LOW (ref 6.5–8.1)

## 2020-04-10 LAB — CBC WITH DIFFERENTIAL/PLATELET
Abs Immature Granulocytes: 0.08 10*3/uL — ABNORMAL HIGH (ref 0.00–0.07)
Basophils Absolute: 0 10*3/uL (ref 0.0–0.1)
Basophils Relative: 0 %
Eosinophils Absolute: 0.1 10*3/uL (ref 0.0–0.5)
Eosinophils Relative: 1 %
HCT: 32 % — ABNORMAL LOW (ref 36.0–46.0)
Hemoglobin: 10.7 g/dL — ABNORMAL LOW (ref 12.0–15.0)
Immature Granulocytes: 1 %
Lymphocytes Relative: 8 %
Lymphs Abs: 0.8 10*3/uL (ref 0.7–4.0)
MCH: 29.8 pg (ref 26.0–34.0)
MCHC: 33.4 g/dL (ref 30.0–36.0)
MCV: 89.1 fL (ref 80.0–100.0)
Monocytes Absolute: 1.2 10*3/uL — ABNORMAL HIGH (ref 0.1–1.0)
Monocytes Relative: 11 %
Neutro Abs: 8.3 10*3/uL — ABNORMAL HIGH (ref 1.7–7.7)
Neutrophils Relative %: 79 %
Platelets: 276 10*3/uL (ref 150–400)
RBC: 3.59 MIL/uL — ABNORMAL LOW (ref 3.87–5.11)
RDW: 13.2 % (ref 11.5–15.5)
WBC: 10.5 10*3/uL (ref 4.0–10.5)
nRBC: 0 % (ref 0.0–0.2)

## 2020-04-10 MED ORDER — DEXTROSE 50 % IV SOLN
50.0000 mL | Freq: Once | INTRAVENOUS | Status: AC
  Administered 2020-04-10: 50 mL via INTRAVENOUS

## 2020-04-10 MED ORDER — DEXTROSE 50 % IV SOLN
INTRAVENOUS | Status: AC
  Filled 2020-04-10: qty 50

## 2020-04-10 NOTE — ED Notes (Signed)
Pt to ct 

## 2020-04-10 NOTE — ED Triage Notes (Signed)
Pt from home with blood sugar of 51 per ems. Pt did not have glucose enroute other than 1/4 of glucose tube. Pt refused iv dextrose. Pt is alert, but slow to respond, complains of headache.

## 2020-04-10 NOTE — ED Notes (Signed)
Report to rebecca, rn.  

## 2020-04-10 NOTE — ED Notes (Signed)
Pt provided with po fluids and has consumed approx 171mL.

## 2020-04-10 NOTE — ED Provider Notes (Signed)
[facility] Provider Note   CSN: [id number] Arrival date & time: 04/10/20  2040     History Chief Complaint  Patient presents with  . Hypoglycemia    Robin Morales is a 86 y.o. female history of A. fib on Eliquis, CAD, CVA, here presenting with altered mental status, fall. Patient fell earlier today.  Patient had some headache afterwards. Patient was noted to be confused and altered.  Patient had a CBG of 40.  EMS gave her oral glucose and on arrival was still 45.  Patient did not know she is diabetic but she is on glipizide.  Per chart review, patient is on Bactrim for UTI. Patient unable to give me much history.  The history is provided by the patient and the EMS personnel.  Level V caveat- AMS, hypoglycemia      Past Medical History:  Diagnosis Date  . A-fib ([place]) 03/28/2016  . Arthritis   . CAD (coronary artery disease) 07/10/2014  . Chronic ischemic left PCA stroke 04/14/2016   Overview:  Left PCA  . CVA (cerebral vascular accident) ([place])   . Diabetes mellitus without complication ([place])   . Gastroesophageal reflux disease without esophagitis 07/05/2015  . Hypercholesterolemia   . Hyperlipidemia 07/10/2014  . Hypertension   . MI (myocardial infarction) ([place])   . TIA (transient ischemic attack) 03/28/2016  . Type 2 diabetes mellitus with diabetic nephropathy ([place]) 01/14/2016    Patient Active Problem List   Diagnosis Date Noted  . Pressure injury of skin 12/04/2018  . Goals of care, counseling/discussion   . Advanced care planning/counseling discussion   . Sepsis ([place]) 12/03/2018  . Acute emphysematous cholecystitis   . Age-related osteoporosis without current pathological fracture 04/04/2017  . Chronic ischemic left PCA stroke 04/14/2016  . A-fib ([place]) 03/28/2016  . TIA (transient ischemic attack) 03/28/2016  . Type 2 diabetes mellitus with diabetic nephropathy ([place]) 01/14/2016  . Type 2 diabetes mellitus with diabetic  neuropathy ([place]) 01/14/2016  . Gastroesophageal reflux disease without esophagitis 07/05/2015  . CAD (coronary artery disease) 07/10/2014  . Hyperlipidemia 07/10/2014  . Hypertension 07/10/2014    Past Surgical History:  Procedure Laterality Date  . HIP ARTHROPLASTY       OB History   No obstetric history on file.     Family History  Problem Relation Age of Onset  . Hypertension Daughter   . Diabetes Sister   . Prostate cancer Neg Hx     Social History   Tobacco Use  . Smoking status: Never Smoker  . Smokeless tobacco: Never Used  Substance Use Topics  . Alcohol use: No  . Drug use: No    Home Medications Prior to Admission medications   Medication Sig Start Date End Date Taking? Authorizing Provider  acetaminophen (TYLENOL) 500 MG tablet Take 500 mg by mouth every 6 (six) hours as needed for mild pain.     [provider]  alendronate (FOSAMAX) 70 MG tablet Take 70 mg by mouth every Thursday.  03/12/18   [provider]  amLODipine (NORVASC) 2.5 MG tablet Take 1 tablet (2.5 mg total) by mouth daily. 12/08/18 02/06/19  Gladstone Lighter, MD  apixaban (ELIQUIS) 2.5 MG TABS tablet Take 1 tablet (2.5 mg total) by mouth 2 (two) times daily. 03/29/16   [facility] Sane, MD  atorvastatin (LIPITOR) 40 MG tablet Take 1 tablet (40 mg total) by mouth daily at 6 PM. Patient taking differently: Take 40 mg by mouth daily.  03/29/16  [facility] Sane, MD  Chlorpheniramine-Acetaminophen (CORICIDIN HBP COLD/FLU PO) Take 1 tablet by mouth daily as needed (cold and flu symptoms).     [provider]  cholecalciferol (VITAMIN D3) 25 MCG (1000 UT) tablet Take 1,000 Units by mouth daily.    [provider]  glipiZIDE (GLUCOTROL) 5 MG tablet Take 5 mg by mouth daily before breakfast.  03/12/18   [provider]  metoprolol (LOPRESSOR) 50 MG tablet Take 50 mg by mouth 2 (two) times daily.     [provider]  omeprazole (PRILOSEC) 40 MG capsule Take 1  capsule by mouth daily. 03/18/16   [provider]  risperiDONE (RISPERDAL) 0.25 MG tablet Take 0.25 mg by mouth 2 (two) times daily.    [provider]  sodium bicarbonate 325 MG tablet Take 325 mg by mouth 4 (four) times daily.    [provider]  vitamin B-12 (CYANOCOBALAMIN) 1000 MCG tablet Take 1,000 mcg by mouth daily.    [provider]    Allergies    Gabapentin and Keflex [cephalexin]  Review of Systems   Review of Systems  Psychiatric/Behavioral: Positive for confusion.  All other systems reviewed and are negative.   Physical Exam Updated Vital Signs BP (!) 154/50 (BP Location: Left Arm)   Pulse 69   Temp (!) 97.1 F (36.2 C) (Oral)   Resp (!) 22   Ht 5\' 2"  (1.575 m)   Wt 72.6 kg   SpO2 93%   BMI 29.26 kg/m   Physical Exam Vitals and nursing note reviewed.  Constitutional:      Comments: Confused   HENT:     Head: Normocephalic.     Comments: No obvious scalp hematoma     Mouth/Throat:     Mouth: Mucous membranes are moist.  Eyes:     Extraocular Movements: Extraocular movements intact.     Pupils: Pupils are equal, round, and reactive to light.  Cardiovascular:     Rate and Rhythm: Normal rate and regular rhythm.     Pulses: Normal pulses.  Pulmonary:     Effort: Pulmonary effort is normal.     Breath sounds: Normal breath sounds.  Abdominal:     General: Abdomen is flat.     Palpations: Abdomen is soft.  Musculoskeletal:        General: Normal range of motion.     Cervical back: Normal range of motion.  Skin:    General: Skin is warm.     Capillary Refill: Capillary refill takes less than 2 seconds.  Neurological:     Comments: A & O x 2. No obvious facial droop, ? Slurred speech but speech is slow. Nl strength bilaterally   Psychiatric:        Mood and Affect: Mood normal.     ED Results / Procedures / Treatments   Labs (all labs ordered are listed, but only abnormal results are displayed) Labs Reviewed    CBC WITH DIFFERENTIAL/PLATELET - Abnormal; Notable for the following components:      Result Value   RBC 3.59 (*)    Hemoglobin 10.7 (*)    HCT 32.0 (*)    Neutro Abs 8.3 (*)    Monocytes Absolute 1.2 (*)    Abs Immature Granulocytes 0.08 (*)    All other components within normal limits  GLUCOSE, CAPILLARY - Abnormal; Notable for the following components:   Glucose-Capillary 201 (*)    All other components within normal limits  URINE CULTURE  COMPREHENSIVE METABOLIC PANEL  URINALYSIS, COMPLETE (UACMP) WITH MICROSCOPIC    EKG None  Radiology No results found.  Procedures Procedures (including critical care time)  Medications Ordered in ED Medications  dextrose 50 % solution (has no administration in time range)  dextrose 50 % solution 50 mL (50 mLs Intravenous Given 04/10/20 2042)    ED Course  I have reviewed the triage vital signs and the nursing notes.  Pertinent labs & imaging results that were available during my care of the patient were reviewed by me and considered in my medical decision making (see chart for details).    MDM Rules/Calculators/A&P                      SHAVAUN OSTERLOH is a 86 y.o. female is here with confusion. Patient was hypoglycemic at home.  Patient also had a head injury is on Eliquis Patient is ANO x2 and has nonfocal neuro exam.  Confusion likely secondary to hypoglycemia.  Given she had head injury, will get CT head as well.  We will also order basic labs and give D50.  Additional history obtained:  Previous records obtained and reviewed    Lab Tests:  I Ordered, reviewed, and interpreted labs, which included:  CBC, CMP, UA   Imaging Studies ordered:  I ordered imaging studies which included CT head, I independently visualized and interpreted imaging which showed no bleed  Medicines ordered:  I ordered medication D50 For hypoglycemia   11:23 PM Initial blood sugar was 46 on the chemistry.  Patient was given D50 and blood sugar  went up to 200 and now stable at 140.  Her CT head did not show any bleed.  UA showed no obvious UTI.  Given that she may have a reaction to Bactrim and has allergy to Keflex, will hold off on antibiotics for now.  We will send off urine culture.  Signed out to Dr. Beather Arbour pending repeat CBG at midnight and if is stable she can be discharged.      Final Clinical Impression(s) / ED Diagnoses Final diagnoses:  None    Rx / DC Orders ED Discharge Orders    None       Drenda Freeze, MD 04/10/20 2325

## 2020-04-10 NOTE — ED Notes (Signed)
Pt assisted to BR at this time 

## 2020-04-11 DIAGNOSIS — E11649 Type 2 diabetes mellitus with hypoglycemia without coma: Secondary | ICD-10-CM

## 2020-04-11 DIAGNOSIS — E162 Hypoglycemia, unspecified: Secondary | ICD-10-CM | POA: Diagnosis present

## 2020-04-11 DIAGNOSIS — N184 Chronic kidney disease, stage 4 (severe): Secondary | ICD-10-CM

## 2020-04-11 LAB — HEMOGLOBIN A1C
Hgb A1c MFr Bld: 6.3 % — ABNORMAL HIGH (ref 4.8–5.6)
Mean Plasma Glucose: 134.11 mg/dL

## 2020-04-11 LAB — SARS CORONAVIRUS 2 (TAT 6-24 HRS): SARS Coronavirus 2: NEGATIVE

## 2020-04-11 LAB — GLUCOSE, CAPILLARY
Glucose-Capillary: 111 mg/dL — ABNORMAL HIGH (ref 70–99)
Glucose-Capillary: 165 mg/dL — ABNORMAL HIGH (ref 70–99)
Glucose-Capillary: 174 mg/dL — ABNORMAL HIGH (ref 70–99)
Glucose-Capillary: 179 mg/dL — ABNORMAL HIGH (ref 70–99)
Glucose-Capillary: 50 mg/dL — ABNORMAL LOW (ref 70–99)
Glucose-Capillary: 93 mg/dL (ref 70–99)

## 2020-04-11 LAB — CBC
HCT: 32.9 % — ABNORMAL LOW (ref 36.0–46.0)
Hemoglobin: 10.7 g/dL — ABNORMAL LOW (ref 12.0–15.0)
MCH: 29.4 pg (ref 26.0–34.0)
MCHC: 32.5 g/dL (ref 30.0–36.0)
MCV: 90.4 fL (ref 80.0–100.0)
Platelets: 286 10*3/uL (ref 150–400)
RBC: 3.64 MIL/uL — ABNORMAL LOW (ref 3.87–5.11)
RDW: 13 % (ref 11.5–15.5)
WBC: 9.6 10*3/uL (ref 4.0–10.5)
nRBC: 0 % (ref 0.0–0.2)

## 2020-04-11 MED ORDER — ACETAMINOPHEN 650 MG RE SUPP: 650.0000 mg | Freq: Four times a day (QID) | RECTAL | Status: DC | PRN

## 2020-04-11 MED ORDER — HYDRALAZINE HCL 20 MG/ML IJ SOLN
10.0000 mg | Freq: Four times a day (QID) | INTRAMUSCULAR | Status: DC | PRN
  Administered 2020-04-11: 07:00:00 10 mg via INTRAVENOUS
  Filled 2020-04-11: qty 1

## 2020-04-11 MED ORDER — DEXTROSE-NACL 5-0.45 % IV SOLN: INTRAVENOUS | Status: DC

## 2020-04-11 MED ORDER — ONDANSETRON HCL 4 MG/2ML IJ SOLN: 4.0000 mg | Freq: Four times a day (QID) | INTRAMUSCULAR | Status: DC | PRN

## 2020-04-11 MED ORDER — DEXTROSE 10 % IV SOLN: INTRAVENOUS | Status: DC

## 2020-04-11 MED ORDER — LABETALOL HCL 5 MG/ML IV SOLN
10.0000 mg | Freq: Four times a day (QID) | INTRAVENOUS | Status: DC | PRN
  Administered 2020-04-11: 10 mg via INTRAVENOUS
  Filled 2020-04-11: qty 4

## 2020-04-11 MED ORDER — ONDANSETRON HCL 4 MG PO TABS: 4.0000 mg | ORAL_TABLET | Freq: Four times a day (QID) | ORAL | Status: DC | PRN

## 2020-04-11 MED ORDER — APIXABAN 2.5 MG PO TABS
2.5000 mg | ORAL_TABLET | Freq: Two times a day (BID) | ORAL | Status: DC
  Administered 2020-04-11: 09:00:00 2.5 mg via ORAL
  Filled 2020-04-11: qty 1

## 2020-04-11 MED ORDER — INSULIN ASPART 100 UNIT/ML ~~LOC~~ SOLN: 0.0000 [IU] | Freq: Three times a day (TID) | SUBCUTANEOUS | Status: DC

## 2020-04-11 MED ORDER — ACETAMINOPHEN 325 MG PO TABS
650.0000 mg | ORAL_TABLET | Freq: Four times a day (QID) | ORAL | Status: DC | PRN
  Administered 2020-04-11: 650 mg via ORAL
  Filled 2020-04-11: qty 2

## 2020-04-11 MED ORDER — ATORVASTATIN CALCIUM 40 MG PO TABS: 40.0000 mg | ORAL_TABLET | Freq: Every day | ORAL | Status: DC

## 2020-04-11 NOTE — ED Provider Notes (Signed)
-----------------------------------------   12:46 AM on 04/11/2020 -----------------------------------------  Repeat blood sugar 50.  Discussed with patient and daughter.  Daughter states she does not think there is a chance patient received extra glipizide.  Will start D5 half-normal infusion and discussed with hospital services for admission for recurrent hypoglycemia.   Paulette Blanch, MD 04/11/20 [phone]

## 2020-04-11 NOTE — ED Notes (Signed)
Call bell answered and pt assisted to toilet, 2 person, fluids

## 2020-04-11 NOTE — Discharge Summary (Signed)
Physician Discharge Summary   Robin Morales  female DOB: [birth year]  OVZ:[id number]  PCP: Baxter Hire, MD  Admit date: 04/10/2020 Discharge date: 04/11/2020  Admitted From: home Disposition:  home Daughters updated on discharge plans prior to discharge. CODE STATUS: Full code  Discharge Instructions    Diet - low sodium heart healthy   Complete by: As directed    Discharge instructions   Complete by: As directed    As we have discussed, please stop taking your glipizide.  At your age, a little higher blood sugar is much better than low blood sugar.   Dr. Enzo Bi - -   Increase activity slowly   Complete by: As directed        Hospital Course:  For full details, please see H&P, progress notes, consult notes and ancillary notes.  Briefly,  Robin Morales is a 86 y.o. female with medical history significant for A. fib on Eliquis, CAD, history of CVA, HTN, diabetes on glipizide and CKD 4 who presented to the emergency room following an episode of altered mental status causing a fall.  Blood sugar at home was 40.    Patient has tolerated glipizide well in the past and there has been no recent changes in medication  Persistent hypoglycemia 2/2 Glipizide Non-insulin-dependent type 2 diabetes mellitus  Pt required D10 gtt overnight to keep BG from dropping.  D10 gtt was d/c'ed around noon on 4/18 when BG was 174, and when recheck 5 hours later around 5 pm remained in 170's, pt was deemed out of risk of hypoglycemia.  Explained to the daughters that given pt's advanced age and reduced kidney function, she is no longer safe to take glipizide.  And given A1c 6.3, pt does not need any more diabetic medications.  Pt's mental status and speech change all resolved when her BG normalized.    Fall Believe related to hypoglycemia of 46 at home.  CT head showed no acute intracranial pathology.    A-fib ([place]) Continued metoprolol and apixaban    CAD (coronary artery  disease) Continued metoprolol and atorvastatin.  Not on aspirin likely as she is on Eliquis for A. fib    Hypertension Continued amlodipine     CKD (chronic kidney disease) stage 4, GFR 15-29 ml/min (HCC) Renal function at baseline   Discharge Diagnoses:  Principal Problem:   Hypoglycemia due to type 2 diabetes mellitus ([place]) Active Problems:   A-fib (H[facility])   CAD (coronary artery disease)   Hypertension   CKD (chronic kidney disease) stage 4, GFR 15-29 ml/min (HCC)   Hypoglycemia    Discharge Instructions:  Allergies as of 04/11/2020      Reactions   Gabapentin    Other reaction(s): Dizziness   Keflex [cephalexin] Itching      Medication List    STOP taking these medications   glipiZIDE 5 MG tablet Commonly known as: GLUCOTROL     TAKE these medications   Acetaminophen 325 MG Caps Take 650 mg by mouth every 6 (six) hours as needed for mild pain.   alendronate 70 MG tablet Commonly known as: FOSAMAX Take 70 mg by mouth every Thursday.   amLODipine 2.5 MG tablet Commonly known as: NORVASC Take 1 tablet (2.5 mg total) by mouth daily.   apixaban 2.5 MG Tabs tablet Commonly known as: ELIQUIS Take 1 tablet (2.5 mg total) by mouth 2 (two) times daily.   atorvastatin 40 MG tablet Commonly known as: LIPITOR Take 1 tablet (40  mg total) by mouth daily.   cholecalciferol 25 MCG (1000 UNIT) tablet Commonly known as: VITAMIN D3 Take 2,000 Units by mouth daily.   CORICIDIN HBP COLD/FLU PO Take 1 tablet by mouth daily as needed (cold and flu symptoms).   metoprolol tartrate 50 MG tablet Commonly known as: LOPRESSOR Take 50 mg by mouth 2 (two) times daily.   omeprazole 40 MG capsule Commonly known as: PRILOSEC Take 1 capsule by mouth daily.   risperiDONE 0.25 MG tablet Commonly known as: RISPERDAL Take 0.25 mg by mouth 2 (two) times daily.   sodium bicarbonate 325 MG tablet Take 325 mg by mouth 4 (four) times daily.   vitamin B-12 1000 MCG  tablet Commonly known as: CYANOCOBALAMIN Take 1,000 mcg by mouth daily.       Follow-up Information    Baxter Hire, MD. Schedule an appointment as soon as possible for a visit in 1 week(s).   Specialty: Internal Medicine Contact information: [place] [address] [phone]           Allergies  Allergen Reactions  . Gabapentin     Other reaction(s): Dizziness  . Keflex [Cephalexin] Itching     The results of significant diagnostics from this hospitalization (including imaging, microbiology, ancillary and laboratory) are listed below for reference.   Consultations:   Procedures/Studies: CT Head Wo Contrast  Result Date: 04/10/2020 CLINICAL DATA:  86 year old female with head trauma. EXAM: CT HEAD WITHOUT CONTRAST TECHNIQUE: Contiguous axial images were obtained from the base of the skull through the vertex without intravenous contrast. COMPARISON:  Head CT dated 03/29/2018. FINDINGS: Brain: There is moderate age-related atrophy and chronic microvascular ischemic changes. There is no acute intracranial hemorrhage. No mass effect or midline shift. No extra-axial fluid collection. Vascular: No hyperdense vessel or unexpected calcification. Skull: Normal. Negative for fracture or focal lesion. Sinuses/Orbits: No acute finding. Other: None IMPRESSION: 1. No acute intracranial pathology. 2. Moderate age-related atrophy and chronic microvascular ischemic changes. Electronically Signed   By: Anner Crete M.D.   On: 04/10/2020 21:42      Labs: BNP (last 3 results) No results for input(s): BNP in the last 8760 hours. Basic Metabolic Panel: Recent Labs  Lab 04/10/20 2057  NA 138  K 3.5  CL 104  CO2 25  GLUCOSE 46*  BUN 28*  CREATININE 1.86*  CALCIUM 8.3*   Liver Function Tests: Recent Labs  Lab 04/10/20 2057  AST 13*  ALT 11  ALKPHOS 68  BILITOT 0.6  PROT 6.2*  ALBUMIN 3.2*   No results for input(s): LIPASE, AMYLASE in the last  168 hours. No results for input(s): AMMONIA in the last 168 hours. CBC: Recent Labs  Lab 04/10/20 2057 04/11/20 0310  WBC 10.5 9.6  NEUTROABS 8.3*  --   HGB 10.7* 10.7*  HCT 32.0* 32.9*  MCV 89.1 90.4  PLT 276 286   Cardiac Enzymes: No results for input(s): CKTOTAL, CKMB, CKMBINDEX, TROPONINI in the last 168 hours. BNP: Invalid input(s): POCBNP CBG: Recent Labs  Lab 04/11/20 0222 04/11/20 0537 04/11/20 0747 04/11/20 1151 04/11/20 1708  GLUCAP 165* 111* 93 174* 179*   D-Dimer No results for input(s): DDIMER in the last 72 hours. Hgb A1c Recent Labs    04/11/20 0310  HGBA1C 6.3*   Lipid Profile No results for input(s): CHOL, HDL, LDLCALC, TRIG, CHOLHDL, LDLDIRECT in the last 72 hours. Thyroid function studies No results for input(s): TSH, T4TOTAL, T3FREE, THYROIDAB in the last 72 hours.  Invalid input(s): FREET3 Anemia work up No results for input(s): VITAMINB12, FOLATE, FERRITIN, TIBC, IRON, RETICCTPCT in the last 72 hours. Urinalysis    Component Value Date/Time   COLORURINE YELLOW (A) 04/10/2020 2057   APPEARANCEUR CLEAR (A) 04/10/2020 2057   APPEARANCEUR Cloudy (A) 05/14/2018 1333   LABSPEC 1.014 04/10/2020 2057   PHURINE 6.0 04/10/2020 2057   GLUCOSEU 50 (A) 04/10/2020 2057   HGBUR LARGE (A) 04/10/2020 2057   BILIRUBINUR NEGATIVE 04/10/2020 2057   BILIRUBINUR Negative 05/14/2018 1333   [place] 04/10/2020 2057   PROTEINUR 30 (A) 04/10/2020 2057   NITRITE NEGATIVE 04/10/2020 2057   LEUKOCYTESUR NEGATIVE 04/10/2020 2057   Sepsis Labs Invalid input(s): PROCALCITONIN,  WBC,  LACTICIDVEN Microbiology No results found for this or any previous visit (from the past 240 hour(s)).   Total time spend on discharging this patient, including the last patient exam, discussing the hospital stay, instructions for ongoing care as it relates to all pertinent caregivers, as well as preparing the medical discharge records, prescriptions, and/or referrals as  applicable, is 60 minutes.    Enzo Bi, MD  [facility] 04/11/2020, 5:14 PM  If 7PM-7AM, please contact night-coverage

## 2020-04-11 NOTE — H&P (Signed)
History and Physical    Robin Morales:[id number] DOB: 1926-03-21 DOA: 04/10/2020  PCP: Baxter Hire, MD   Patient coming from: Home I have personally briefly reviewed patient's old medical records in [place]  Chief Complaint: Altered mental status, low blood sugar  HPI: Robin Morales is a 86 y.o. female with medical history significant for A. fib on Eliquis, CAD, history of CVA, HTN, diabetes on glipizide and CKD 4 who presents to the emergency room following an episode of altered mental status causing a fall.  Blood sugar at home was 40.  Patient was currently on Bactrim for treatment of UTI.  History taken from daughter at bedside.  No reports of nausea vomiting or decreased appetite.  Patient has tolerated glipizide well in the past and there has been no recent changes in medication  ED Course: In the emergency room vitals were unremarkable, blood sugar 46.  Creatinine 1.86 which is better than her baseline of 2.  WBC 10,500.  Urinalysis unremarkable.  CT head shows no acute intracranial findings.  Patient was started on D5 NS.  Hospitalist consulted for admission.  Review of Systems: Patient disoriented so history unreliable  Past Medical History:  Diagnosis Date  . A-fib ([place]) 03/28/2016  . Arthritis   . CAD (coronary artery disease) 07/10/2014  . Chronic ischemic left PCA stroke 04/14/2016   Overview:  Left PCA  . CVA (cerebral vascular accident) ([place])   . Diabetes mellitus without complication ([place])   . Gastroesophageal reflux disease without esophagitis 07/05/2015  . Hypercholesterolemia   . Hyperlipidemia 07/10/2014  . Hypertension   . MI (myocardial infarction) ([place])   . TIA (transient ischemic attack) 03/28/2016  . Type 2 diabetes mellitus with diabetic nephropathy ([place]) 01/14/2016    Past Surgical History:  Procedure Laterality Date  . HIP ARTHROPLASTY       reports that she has never smoked. She has never used smokeless tobacco. She reports that she  does not drink alcohol or use drugs.  Allergies  Allergen Reactions  . Gabapentin     Other reaction(s): Dizziness  . Keflex [Cephalexin] Itching    Family History  Problem Relation Age of Onset  . Hypertension Daughter   . Diabetes Sister   . Prostate cancer Neg Hx      Prior to Admission medications   Medication Sig Start Date End Date Taking? Authorizing Provider  acetaminophen (TYLENOL) 500 MG tablet Take 500 mg by mouth every 6 (six) hours as needed for mild pain.     [provider]  alendronate (FOSAMAX) 70 MG tablet Take 70 mg by mouth every Thursday.  03/12/18   [provider]  amLODipine (NORVASC) 2.5 MG tablet Take 1 tablet (2.5 mg total) by mouth daily. 12/08/18 02/06/19  Gladstone Lighter, MD  apixaban (ELIQUIS) 2.5 MG TABS tablet Take 1 tablet (2.5 mg total) by mouth 2 (two) times daily. 03/29/16   Max Sane, MD  atorvastatin (LIPITOR) 40 MG tablet Take 1 tablet (40 mg total) by mouth daily at 6 PM. Patient taking differently: Take 40 mg by mouth daily.  03/29/16   Max Sane, MD  Chlorpheniramine-Acetaminophen (CORICIDIN HBP COLD/FLU PO) Take 1 tablet by mouth daily as needed (cold and flu symptoms).     [provider]  cholecalciferol (VITAMIN D3) 25 MCG (1000 UT) tablet Take 1,000 Units by mouth daily.    [provider]  glipiZIDE (GLUCOTROL) 5 MG tablet Take 5 mg by mouth daily before breakfast.  03/12/18   [provider]  metoprolol (LOPRESSOR) 50 MG tablet Take 50 mg by mouth 2 (two) times daily.     [provider]  omeprazole (PRILOSEC) 40 MG capsule Take 1 capsule by mouth daily. 03/18/16   [provider]  risperiDONE (RISPERDAL) 0.25 MG tablet Take 0.25 mg by mouth 2 (two) times daily.    [provider]  sodium bicarbonate 325 MG tablet Take 325 mg by mouth 4 (four) times daily.    [provider]  vitamin B-12 (CYANOCOBALAMIN) 1000 MCG tablet Take 1,000 mcg by mouth daily.     [provider]    Physical Exam: Vitals:   04/10/20 2200 04/10/20 2230 04/10/20 2300 04/11/20 0112  BP:    (!) 145/85  Pulse: 63 63 60 64  Resp: 16 16 12 14   Temp:      TempSrc:      SpO2: 93% 96% 95% 94%  Weight:      Height:         Vitals:   04/10/20 2200 04/10/20 2230 04/10/20 2300 04/11/20 0112  BP:    (!) 145/85  Pulse: 63 63 60 64  Resp: 16 16 12 14   Temp:      TempSrc:      SpO2: 93% 96% 95% 94%  Weight:      Height:        Constitutional: Alert and awake, oriented x2, not in any acute distress. Eyes: PERLA, EOMI, irises appear normal, anicteric sclera,  ENMT: external ears and nose appear normal, normal hearing             Lips appears normal, oropharynx mucosa, tongue, posterior pharynx appear normal  Neck: neck appears normal, no masses, normal ROM, no thyromegaly, no JVD  CVS: S1-S2 clear, no murmur rubs or gallops,  , no carotid bruits, pedal pulses palpable, No LE edema Respiratory:  clear to auscultation bilaterally, no wheezing, rales or rhonchi. Respiratory effort normal. No accessory muscle use.  Abdomen: soft nontender, nondistended, normal bowel sounds, no hepatosplenomegaly, no hernias Musculoskeletal: : no cyanosis, clubbing , no contractures or atrophy Neuro: Cranial nerves II-XII intact, sensation, reflexes normal, strength Psych: judgement and insight appear normal, stable mood and affect,  Skin: no rashes or lesions or ulcers, no induration or nodules   Labs on Admission: I have personally reviewed following labs and imaging studies  CBC: Recent Labs  Lab 04/10/20 2057  WBC 10.5  NEUTROABS 8.3*  HGB 10.7*  HCT 32.0*  MCV 89.1  PLT 195   Basic Metabolic Panel: Recent Labs  Lab 04/10/20 2057  NA 138  K 3.5  CL 104  CO2 25  GLUCOSE 46*  BUN 28*  CREATININE 1.86*  CALCIUM 8.3*   GFR: Estimated Creatinine Clearance: 18.4 mL/min (A) (by C-G formula based on SCr of 1.86 mg/dL (H)). Liver Function Tests: Recent Labs    Lab 04/10/20 2057  AST 13*  ALT 11  ALKPHOS 68  BILITOT 0.6  PROT 6.2*  ALBUMIN 3.2*   No results for input(s): LIPASE, AMYLASE in the last 168 hours. No results for input(s): AMMONIA in the last 168 hours. Coagulation Profile: No results for input(s): INR, PROTIME in the last 168 hours. Cardiac Enzymes: No results for input(s): CKTOTAL, CKMB, CKMBINDEX, TROPONINI in the last 168 hours. BNP (last 3 results) No results for input(s): PROBNP in the last 8760 hours. HbA1C: No results for input(s): HGBA1C in the last 72 hours. CBG: Recent Labs  Lab 04/10/20  2102 04/10/20 2148 04/11/20 0017  GLUCAP 201* 140* 50*   Lipid Profile: No results for input(s): CHOL, HDL, LDLCALC, TRIG, CHOLHDL, LDLDIRECT in the last 72 hours. Thyroid Function Tests: No results for input(s): TSH, T4TOTAL, FREET4, T3FREE, THYROIDAB in the last 72 hours. Anemia Panel: No results for input(s): VITAMINB12, FOLATE, FERRITIN, TIBC, IRON, RETICCTPCT in the last 72 hours. Urine analysis:    Component Value Date/Time   COLORURINE YELLOW (A) 04/10/2020 2057   APPEARANCEUR CLEAR (A) 04/10/2020 2057   APPEARANCEUR Cloudy (A) 05/14/2018 1333   LABSPEC 1.014 04/10/2020 2057   PHURINE 6.0 04/10/2020 2057   GLUCOSEU 50 (A) 04/10/2020 2057   HGBUR LARGE (A) 04/10/2020 2057   BILIRUBINUR NEGATIVE 04/10/2020 2057   BILIRUBINUR Negative 05/14/2018 1333   KETONESUR NEGATIVE 04/10/2020 2057   PROTEINUR 30 (A) 04/10/2020 2057   NITRITE NEGATIVE 04/10/2020 2057   LEUKOCYTESUR NEGATIVE 04/10/2020 2057    Radiological Exams on Admission: CT Head Wo Contrast  Result Date: 04/10/2020 CLINICAL DATA:  86 year old female with head trauma. EXAM: CT HEAD WITHOUT CONTRAST TECHNIQUE: Contiguous axial images were obtained from the base of the skull through the vertex without intravenous contrast. COMPARISON:  Head CT dated 03/29/2018. FINDINGS: Brain: There is moderate age-related atrophy and chronic microvascular ischemic  changes. There is no acute intracranial hemorrhage. No mass effect or midline shift. No extra-axial fluid collection. Vascular: No hyperdense vessel or unexpected calcification. Skull: Normal. Negative for fracture or focal lesion. Sinuses/Orbits: No acute finding. Other: None IMPRESSION: 1. No acute intracranial pathology. 2. Moderate age-related atrophy and chronic microvascular ischemic changes. Electronically Signed   By: Anner Crete M.D.   On: 04/10/2020 21:42    EKG: Independently reviewed.   Assessment/Plan Principal Problem:   Hypoglycemia due to type 2 diabetes mellitus ([place]) -IV D10 to transition off once blood sugars remaining stable above 100 -Glipizide may no longer be an oral option for patient given age and abnormal renal function, discussed with daughter -Sliding scale coverage  Fall -Believe related to hypoglycemia of 46 at home -CT head shows no acute intracranial pathology -Neurologic checks with fall and aspiration precautions    A-fib (HCC) -Continue metoprolol and apixaban    CAD (coronary artery disease) -Continue metoprolol and atorvastatin.  Not on aspirin likely as she is on Eliquis for A. fib    Hypertension -Continue amlodipine pending med rec    CKD (chronic kidney disease) stage 4, GFR 15-29 ml/min (HCC) -Renal function at baseline     DVT prophylaxis: On apixaban  code Status: full code, pending further discussion with sister who has POA Family Communication: Daughter, Anjelina Dung Disposition Plan: Back to previous home environment Consults called: none  Status:obs    Athena Masse MD [facility]     04/11/2020, 1:20 AM

## 2020-04-11 NOTE — Progress Notes (Signed)
Provider made aware that the blood pressure is not responding to the labetalol. Awaiting new orders. Will endorse to oncoming RN

## 2020-04-11 NOTE — Progress Notes (Signed)
Pt stated she felt SOB. On assessment pt was sating @ 94% on RA and respirations were 19/min. Pt sat up in bed without relief. Placed pt on 2L O2 for comfort. Pt sating 96% at this time.

## 2020-04-11 NOTE — Progress Notes (Signed)
Patient admitted for a fall from home with no injuries. Patient is on D10 for low glucose levels. Patient received labetalol for SBP 177.

## 2020-04-11 NOTE — ED Notes (Signed)
Pt given orange juice at this time  

## 2020-04-12 LAB — URINE CULTURE: Culture: NO GROWTH

## 2020-04-21 DIAGNOSIS — E1121 Type 2 diabetes mellitus with diabetic nephropathy: Secondary | ICD-10-CM | POA: Diagnosis not present

## 2020-04-21 DIAGNOSIS — E162 Hypoglycemia, unspecified: Secondary | ICD-10-CM | POA: Diagnosis not present

## 2020-04-21 DIAGNOSIS — Z09 Encounter for follow-up examination after completed treatment for conditions other than malignant neoplasm: Secondary | ICD-10-CM | POA: Diagnosis not present

## 2020-06-08 DIAGNOSIS — E1121 Type 2 diabetes mellitus with diabetic nephropathy: Secondary | ICD-10-CM | POA: Diagnosis not present

## 2020-06-08 DIAGNOSIS — R399 Unspecified symptoms and signs involving the genitourinary system: Secondary | ICD-10-CM | POA: Diagnosis not present

## 2020-06-15 DIAGNOSIS — N1832 Chronic kidney disease, stage 3b: Secondary | ICD-10-CM | POA: Diagnosis not present

## 2020-06-15 DIAGNOSIS — I129 Hypertensive chronic kidney disease with stage 1 through stage 4 chronic kidney disease, or unspecified chronic kidney disease: Secondary | ICD-10-CM | POA: Diagnosis not present

## 2020-06-15 DIAGNOSIS — G301 Alzheimer's disease with late onset: Secondary | ICD-10-CM | POA: Diagnosis not present

## 2020-06-15 DIAGNOSIS — Z0001 Encounter for general adult medical examination with abnormal findings: Secondary | ICD-10-CM | POA: Diagnosis not present

## 2020-06-15 DIAGNOSIS — E7800 Pure hypercholesterolemia, unspecified: Secondary | ICD-10-CM | POA: Diagnosis not present

## 2020-06-15 DIAGNOSIS — E1122 Type 2 diabetes mellitus with diabetic chronic kidney disease: Secondary | ICD-10-CM | POA: Diagnosis not present

## 2020-06-15 DIAGNOSIS — F0281 Dementia in other diseases classified elsewhere with behavioral disturbance: Secondary | ICD-10-CM | POA: Diagnosis not present

## 2020-06-15 DIAGNOSIS — E1140 Type 2 diabetes mellitus with diabetic neuropathy, unspecified: Secondary | ICD-10-CM | POA: Diagnosis not present

## 2020-06-15 DIAGNOSIS — I4891 Unspecified atrial fibrillation: Secondary | ICD-10-CM | POA: Diagnosis not present

## 2020-09-14 DIAGNOSIS — R8290 Unspecified abnormal findings in urine: Secondary | ICD-10-CM | POA: Diagnosis not present

## 2020-09-14 DIAGNOSIS — R399 Unspecified symptoms and signs involving the genitourinary system: Secondary | ICD-10-CM | POA: Diagnosis not present

## 2020-10-08 DIAGNOSIS — E1121 Type 2 diabetes mellitus with diabetic nephropathy: Secondary | ICD-10-CM | POA: Diagnosis not present

## 2020-10-26 DIAGNOSIS — E7800 Pure hypercholesterolemia, unspecified: Secondary | ICD-10-CM | POA: Diagnosis not present

## 2020-10-26 DIAGNOSIS — I4891 Unspecified atrial fibrillation: Secondary | ICD-10-CM | POA: Diagnosis not present

## 2020-10-26 DIAGNOSIS — E1122 Type 2 diabetes mellitus with diabetic chronic kidney disease: Secondary | ICD-10-CM | POA: Diagnosis not present

## 2020-10-26 DIAGNOSIS — F0281 Dementia in other diseases classified elsewhere with behavioral disturbance: Secondary | ICD-10-CM | POA: Diagnosis not present

## 2020-10-26 DIAGNOSIS — I129 Hypertensive chronic kidney disease with stage 1 through stage 4 chronic kidney disease, or unspecified chronic kidney disease: Secondary | ICD-10-CM | POA: Diagnosis not present

## 2020-10-26 DIAGNOSIS — N1832 Chronic kidney disease, stage 3b: Secondary | ICD-10-CM | POA: Diagnosis not present

## 2020-10-26 DIAGNOSIS — G301 Alzheimer's disease with late onset: Secondary | ICD-10-CM | POA: Diagnosis not present

## 2020-10-26 DIAGNOSIS — Z23 Encounter for immunization: Secondary | ICD-10-CM | POA: Diagnosis not present

## 2020-10-26 DIAGNOSIS — I2510 Atherosclerotic heart disease of native coronary artery without angina pectoris: Secondary | ICD-10-CM | POA: Diagnosis not present

## 2021-02-16 DIAGNOSIS — E1121 Type 2 diabetes mellitus with diabetic nephropathy: Secondary | ICD-10-CM | POA: Diagnosis not present

## 2021-03-11 DIAGNOSIS — E1140 Type 2 diabetes mellitus with diabetic neuropathy, unspecified: Secondary | ICD-10-CM | POA: Diagnosis not present

## 2021-03-11 DIAGNOSIS — F0281 Dementia in other diseases classified elsewhere with behavioral disturbance: Secondary | ICD-10-CM | POA: Diagnosis not present

## 2021-03-11 DIAGNOSIS — I2510 Atherosclerotic heart disease of native coronary artery without angina pectoris: Secondary | ICD-10-CM | POA: Diagnosis not present

## 2021-03-11 DIAGNOSIS — N1832 Chronic kidney disease, stage 3b: Secondary | ICD-10-CM | POA: Diagnosis not present

## 2021-03-11 DIAGNOSIS — I4891 Unspecified atrial fibrillation: Secondary | ICD-10-CM | POA: Diagnosis not present

## 2021-03-11 DIAGNOSIS — I10 Essential (primary) hypertension: Secondary | ICD-10-CM | POA: Diagnosis not present

## 2021-03-11 DIAGNOSIS — E1122 Type 2 diabetes mellitus with diabetic chronic kidney disease: Secondary | ICD-10-CM | POA: Diagnosis not present

## 2021-03-11 DIAGNOSIS — G301 Alzheimer's disease with late onset: Secondary | ICD-10-CM | POA: Diagnosis not present

## 2021-03-11 DIAGNOSIS — Z0000 Encounter for general adult medical examination without abnormal findings: Secondary | ICD-10-CM | POA: Diagnosis not present

## 2021-07-04 ENCOUNTER — Other Ambulatory Visit: Payer: Self-pay

## 2021-07-04 ENCOUNTER — Inpatient Hospital Stay
Admission: EM | Admit: 2021-07-04 | Discharge: 2021-07-11 | DRG: 871 | Disposition: A | Discharge status: Hospice | Payer: Medicare HMO | Attending: Internal Medicine | Admitting: Internal Medicine

## 2021-07-04 ENCOUNTER — Emergency Department: Payer: Medicare HMO

## 2021-07-04 DIAGNOSIS — Z66 Do not resuscitate: Secondary | ICD-10-CM | POA: Diagnosis present

## 2021-07-04 DIAGNOSIS — K219 Gastro-esophageal reflux disease without esophagitis: Secondary | ICD-10-CM | POA: Diagnosis present

## 2021-07-04 DIAGNOSIS — R509 Fever, unspecified: Secondary | ICD-10-CM | POA: Diagnosis not present

## 2021-07-04 DIAGNOSIS — Z833 Family history of diabetes mellitus: Secondary | ICD-10-CM

## 2021-07-04 DIAGNOSIS — Z515 Encounter for palliative care: Secondary | ICD-10-CM | POA: Diagnosis not present

## 2021-07-04 DIAGNOSIS — R1310 Dysphagia, unspecified: Secondary | ICD-10-CM | POA: Diagnosis present

## 2021-07-04 DIAGNOSIS — R0689 Other abnormalities of breathing: Secondary | ICD-10-CM | POA: Diagnosis not present

## 2021-07-04 DIAGNOSIS — R7401 Elevation of levels of liver transaminase levels: Secondary | ICD-10-CM | POA: Diagnosis not present

## 2021-07-04 DIAGNOSIS — Z20822 Contact with and (suspected) exposure to covid-19: Secondary | ICD-10-CM | POA: Diagnosis not present

## 2021-07-04 DIAGNOSIS — I252 Old myocardial infarction: Secondary | ICD-10-CM | POA: Diagnosis not present

## 2021-07-04 DIAGNOSIS — I6782 Cerebral ischemia: Secondary | ICD-10-CM | POA: Diagnosis not present

## 2021-07-04 DIAGNOSIS — F0390 Unspecified dementia without behavioral disturbance: Secondary | ICD-10-CM | POA: Diagnosis not present

## 2021-07-04 DIAGNOSIS — K8012 Calculus of gallbladder with acute and chronic cholecystitis without obstruction: Secondary | ICD-10-CM | POA: Diagnosis not present

## 2021-07-04 DIAGNOSIS — H9190 Unspecified hearing loss, unspecified ear: Secondary | ICD-10-CM | POA: Diagnosis present

## 2021-07-04 DIAGNOSIS — N184 Chronic kidney disease, stage 4 (severe): Secondary | ICD-10-CM | POA: Diagnosis present

## 2021-07-04 DIAGNOSIS — R4182 Altered mental status, unspecified: Secondary | ICD-10-CM

## 2021-07-04 DIAGNOSIS — A419 Sepsis, unspecified organism: Secondary | ICD-10-CM | POA: Diagnosis not present

## 2021-07-04 DIAGNOSIS — Z8249 Family history of ischemic heart disease and other diseases of the circulatory system: Secondary | ICD-10-CM | POA: Diagnosis not present

## 2021-07-04 DIAGNOSIS — R410 Disorientation, unspecified: Secondary | ICD-10-CM | POA: Diagnosis not present

## 2021-07-04 DIAGNOSIS — I482 Chronic atrial fibrillation, unspecified: Secondary | ICD-10-CM | POA: Diagnosis present

## 2021-07-04 DIAGNOSIS — Z7983 Long term (current) use of bisphosphonates: Secondary | ICD-10-CM

## 2021-07-04 DIAGNOSIS — N390 Urinary tract infection, site not specified: Secondary | ICD-10-CM | POA: Diagnosis present

## 2021-07-04 DIAGNOSIS — Z7901 Long term (current) use of anticoagulants: Secondary | ICD-10-CM | POA: Diagnosis not present

## 2021-07-04 DIAGNOSIS — I2510 Atherosclerotic heart disease of native coronary artery without angina pectoris: Secondary | ICD-10-CM | POA: Diagnosis present

## 2021-07-04 DIAGNOSIS — R000 Tachycardia, unspecified: Secondary | ICD-10-CM | POA: Diagnosis not present

## 2021-07-04 DIAGNOSIS — R0603 Acute respiratory distress: Secondary | ICD-10-CM | POA: Diagnosis not present

## 2021-07-04 DIAGNOSIS — Z87440 Personal history of urinary (tract) infections: Secondary | ICD-10-CM

## 2021-07-04 DIAGNOSIS — E1122 Type 2 diabetes mellitus with diabetic chronic kidney disease: Secondary | ICD-10-CM | POA: Diagnosis present

## 2021-07-04 DIAGNOSIS — Z8673 Personal history of transient ischemic attack (TIA), and cerebral infarction without residual deficits: Secondary | ICD-10-CM

## 2021-07-04 DIAGNOSIS — I129 Hypertensive chronic kidney disease with stage 1 through stage 4 chronic kidney disease, or unspecified chronic kidney disease: Secondary | ICD-10-CM | POA: Diagnosis present

## 2021-07-04 DIAGNOSIS — R0602 Shortness of breath: Secondary | ICD-10-CM | POA: Diagnosis not present

## 2021-07-04 DIAGNOSIS — J9811 Atelectasis: Secondary | ICD-10-CM | POA: Diagnosis not present

## 2021-07-04 DIAGNOSIS — Z888 Allergy status to other drugs, medicaments and biological substances status: Secondary | ICD-10-CM

## 2021-07-04 DIAGNOSIS — E7800 Pure hypercholesterolemia, unspecified: Secondary | ICD-10-CM | POA: Diagnosis present

## 2021-07-04 DIAGNOSIS — R0902 Hypoxemia: Secondary | ICD-10-CM | POA: Diagnosis not present

## 2021-07-04 DIAGNOSIS — G9341 Metabolic encephalopathy: Secondary | ICD-10-CM | POA: Diagnosis present

## 2021-07-04 DIAGNOSIS — K8020 Calculus of gallbladder without cholecystitis without obstruction: Secondary | ICD-10-CM | POA: Diagnosis not present

## 2021-07-04 DIAGNOSIS — Z79899 Other long term (current) drug therapy: Secondary | ICD-10-CM | POA: Diagnosis not present

## 2021-07-04 DIAGNOSIS — I10 Essential (primary) hypertension: Secondary | ICD-10-CM | POA: Diagnosis not present

## 2021-07-04 DIAGNOSIS — R7989 Other specified abnormal findings of blood chemistry: Secondary | ICD-10-CM | POA: Diagnosis present

## 2021-07-04 DIAGNOSIS — N179 Acute kidney failure, unspecified: Secondary | ICD-10-CM | POA: Diagnosis present

## 2021-07-04 DIAGNOSIS — E1169 Type 2 diabetes mellitus with other specified complication: Secondary | ICD-10-CM | POA: Diagnosis not present

## 2021-07-04 DIAGNOSIS — M1990 Unspecified osteoarthritis, unspecified site: Secondary | ICD-10-CM | POA: Diagnosis present

## 2021-07-04 DIAGNOSIS — I6523 Occlusion and stenosis of bilateral carotid arteries: Secondary | ICD-10-CM | POA: Diagnosis not present

## 2021-07-04 DIAGNOSIS — J449 Chronic obstructive pulmonary disease, unspecified: Secondary | ICD-10-CM | POA: Diagnosis present

## 2021-07-04 DIAGNOSIS — K5900 Constipation, unspecified: Secondary | ICD-10-CM | POA: Diagnosis present

## 2021-07-04 DIAGNOSIS — Z7189 Other specified counseling: Secondary | ICD-10-CM

## 2021-07-04 DIAGNOSIS — K838 Other specified diseases of biliary tract: Secondary | ICD-10-CM | POA: Diagnosis not present

## 2021-07-04 LAB — CBC WITH DIFFERENTIAL/PLATELET
Abs Immature Granulocytes: 0.07 10*3/uL (ref 0.00–0.07)
Basophils Absolute: 0.1 10*3/uL (ref 0.0–0.1)
Basophils Relative: 1 %
Eosinophils Absolute: 0 10*3/uL (ref 0.0–0.5)
Eosinophils Relative: 0 %
HCT: 37.2 % (ref 36.0–46.0)
Hemoglobin: 12.2 g/dL (ref 12.0–15.0)
Immature Granulocytes: 1 %
Lymphocytes Relative: 4 %
Lymphs Abs: 0.4 10*3/uL — ABNORMAL LOW (ref 0.7–4.0)
MCH: 29.3 pg (ref 26.0–34.0)
MCHC: 32.8 g/dL (ref 30.0–36.0)
MCV: 89.4 fL (ref 80.0–100.0)
Monocytes Absolute: 0.3 10*3/uL (ref 0.1–1.0)
Monocytes Relative: 3 %
Neutro Abs: 10.7 10*3/uL — ABNORMAL HIGH (ref 1.7–7.7)
Neutrophils Relative %: 91 %
Platelets: 230 10*3/uL (ref 150–400)
RBC: 4.16 MIL/uL (ref 3.87–5.11)
RDW: 14 % (ref 11.5–15.5)
WBC: 11.5 10*3/uL — ABNORMAL HIGH (ref 4.0–10.5)
nRBC: 0 % (ref 0.0–0.2)

## 2021-07-04 LAB — COMPREHENSIVE METABOLIC PANEL
ALT: 99 U/L — ABNORMAL HIGH (ref 0–44)
AST: 310 U/L — ABNORMAL HIGH (ref 15–41)
Albumin: 3.4 g/dL — ABNORMAL LOW (ref 3.5–5.0)
Alkaline Phosphatase: 259 U/L — ABNORMAL HIGH (ref 38–126)
Anion gap: 10 (ref 5–15)
BUN: 31 mg/dL — ABNORMAL HIGH (ref 8–23)
CO2: 25 mmol/L (ref 22–32)
Calcium: 9 mg/dL (ref 8.9–10.3)
Chloride: 106 mmol/L (ref 98–111)
Creatinine, Ser: 1.27 mg/dL — ABNORMAL HIGH (ref 0.44–1.00)
GFR, Estimated: 39 mL/min — ABNORMAL LOW (ref >60)
Glucose, Bld: 177 mg/dL — ABNORMAL HIGH (ref 70–99)
Potassium: 3.7 mmol/L (ref 3.5–5.1)
Sodium: 141 mmol/L (ref 135–145)
Total Bilirubin: 1.7 mg/dL — ABNORMAL HIGH (ref 0.3–1.2)
Total Protein: 6.5 g/dL (ref 6.5–8.1)

## 2021-07-04 LAB — RESP PANEL BY RT-PCR (FLU A&B, COVID) ARPGX2
Influenza A by PCR: NEGATIVE
Influenza B by PCR: NEGATIVE
SARS Coronavirus 2 by RT PCR: NEGATIVE

## 2021-07-04 LAB — APTT: aPTT: 30 seconds (ref 24–36)

## 2021-07-04 LAB — LACTIC ACID, PLASMA
Lactic Acid, Venous: 2.5 mmol/L (ref 0.5–1.9)
Lactic Acid, Venous: 2.5 mmol/L (ref 0.5–1.9)

## 2021-07-04 LAB — LIPASE, BLOOD: Lipase: 23 U/L (ref 11–51)

## 2021-07-04 LAB — PROTIME-INR
INR: 1.3 — ABNORMAL HIGH (ref 0.8–1.2)
Prothrombin Time: 16.2 seconds — ABNORMAL HIGH (ref 11.4–15.2)

## 2021-07-04 LAB — BRAIN NATRIURETIC PEPTIDE: B Natriuretic Peptide: 296 pg/mL — ABNORMAL HIGH (ref 0.0–100.0)

## 2021-07-04 MED ORDER — VITAMIN B-12 1000 MCG PO TABS
1000.0000 ug | ORAL_TABLET | Freq: Every day | ORAL | Status: DC
  Filled 2021-07-04: qty 1

## 2021-07-04 MED ORDER — LACTATED RINGERS IV BOLUS
1000.0000 mL | Freq: Once | INTRAVENOUS | Status: AC
  Administered 2021-07-04: 1000 mL via INTRAVENOUS

## 2021-07-04 MED ORDER — SODIUM CHLORIDE 0.9 % IV SOLN
2.0000 g | INTRAVENOUS | Status: DC
  Administered 2021-07-05: 2 g via INTRAVENOUS
  Filled 2021-07-04: qty 2
  Filled 2021-07-04: qty 20

## 2021-07-04 MED ORDER — APIXABAN 2.5 MG PO TABS
2.5000 mg | ORAL_TABLET | Freq: Two times a day (BID) | ORAL | Status: DC
  Administered 2021-07-05 – 2021-07-06 (×3): 2.5 mg via ORAL
  Filled 2021-07-04 (×7): qty 1

## 2021-07-04 MED ORDER — METOPROLOL TARTRATE 5 MG/5ML IV SOLN
2.5000 mg | Freq: Four times a day (QID) | INTRAVENOUS | Status: DC | PRN
  Administered 2021-07-05: 18:00:00 2.5 mg via INTRAVENOUS
  Filled 2021-07-04: qty 5

## 2021-07-04 MED ORDER — PANTOPRAZOLE SODIUM 40 MG PO TBEC
40.0000 mg | DELAYED_RELEASE_TABLET | Freq: Every day | ORAL | Status: DC
  Administered 2021-07-05: 40 mg via ORAL
  Filled 2021-07-04: qty 1

## 2021-07-04 MED ORDER — SODIUM CHLORIDE 0.9 % IV SOLN: 1.0000 g | INTRAVENOUS | Status: DC

## 2021-07-04 MED ORDER — RISPERIDONE 0.25 MG PO TABS
0.2500 mg | ORAL_TABLET | Freq: Two times a day (BID) | ORAL | Status: DC
  Filled 2021-07-04 (×2): qty 1

## 2021-07-04 MED ORDER — ORAL CARE MOUTH RINSE
15.0000 mL | Freq: Two times a day (BID) | OROMUCOSAL | Status: DC
  Administered 2021-07-05 – 2021-07-11 (×9): 15 mL via OROMUCOSAL

## 2021-07-04 MED ORDER — ONDANSETRON HCL 4 MG/2ML IJ SOLN
4.0000 mg | Freq: Four times a day (QID) | INTRAMUSCULAR | Status: DC | PRN
  Administered 2021-07-05: 11:00:00 4 mg via INTRAVENOUS
  Filled 2021-07-04: qty 2

## 2021-07-04 MED ORDER — ACETAMINOPHEN 650 MG RE SUPP
650.0000 mg | Freq: Once | RECTAL | Status: AC
  Administered 2021-07-04: 650 mg via RECTAL
  Filled 2021-07-04: qty 1

## 2021-07-04 MED ORDER — SODIUM CHLORIDE 0.9 % IV SOLN
1.0000 g | Freq: Once | INTRAVENOUS | Status: AC
  Administered 2021-07-04: 1 g via INTRAVENOUS
  Filled 2021-07-04: qty 10

## 2021-07-04 MED ORDER — METRONIDAZOLE 500 MG/100ML IV SOLN
500.0000 mg | Freq: Once | INTRAVENOUS | Status: AC
  Administered 2021-07-04: 500 mg via INTRAVENOUS
  Filled 2021-07-04: qty 100

## 2021-07-04 MED ORDER — ACETAMINOPHEN 650 MG RE SUPP
650.0000 mg | Freq: Four times a day (QID) | RECTAL | Status: DC | PRN
  Administered 2021-07-05: 650 mg via RECTAL
  Filled 2021-07-04: qty 1

## 2021-07-04 MED ORDER — SENNOSIDES-DOCUSATE SODIUM 8.6-50 MG PO TABS: 1.0000 | ORAL_TABLET | Freq: Every evening | ORAL | Status: DC | PRN

## 2021-07-04 MED ORDER — SODIUM CHLORIDE 0.9 % IV SOLN: INTRAVENOUS | Status: DC

## 2021-07-04 MED ORDER — BACID PO TABS: 2.0000 | ORAL_TABLET | Freq: Three times a day (TID) | ORAL | Status: DC

## 2021-07-04 MED ORDER — SODIUM BICARBONATE 650 MG PO TABS
325.0000 mg | ORAL_TABLET | Freq: Four times a day (QID) | ORAL | Status: DC
  Filled 2021-07-04 (×3): qty 0.5

## 2021-07-04 MED ORDER — VITAMIN D 25 MCG (1000 UNIT) PO TABS
2000.0000 [IU] | ORAL_TABLET | Freq: Every day | ORAL | Status: DC
  Administered 2021-07-05: 2000 [IU] via ORAL
  Filled 2021-07-04: qty 2

## 2021-07-04 MED ORDER — METRONIDAZOLE 500 MG/100ML IV SOLN
500.0000 mg | Freq: Three times a day (TID) | INTRAVENOUS | Status: DC
  Administered 2021-07-04 – 2021-07-05 (×3): 500 mg via INTRAVENOUS
  Filled 2021-07-04 (×5): qty 100

## 2021-07-04 MED ORDER — METRONIDAZOLE 500 MG/100ML IV SOLN: 500.0000 mg | Freq: Three times a day (TID) | INTRAVENOUS | Status: DC

## 2021-07-04 MED ORDER — ONDANSETRON HCL 4 MG PO TABS: 4.0000 mg | ORAL_TABLET | Freq: Four times a day (QID) | ORAL | Status: DC | PRN

## 2021-07-04 MED ORDER — HYDROMORPHONE HCL 1 MG/ML IJ SOLN: 0.5000 mg | INTRAMUSCULAR | Status: DC | PRN

## 2021-07-04 MED ORDER — ONDANSETRON HCL 4 MG/2ML IJ SOLN
4.0000 mg | Freq: Once | INTRAMUSCULAR | Status: AC
  Administered 2021-07-04: 4 mg via INTRAVENOUS
  Filled 2021-07-04: qty 2

## 2021-07-04 MED ORDER — RISAQUAD PO CAPS
1.0000 | ORAL_CAPSULE | Freq: Three times a day (TID) | ORAL | Status: DC
  Filled 2021-07-04: qty 1

## 2021-07-04 NOTE — ED Notes (Signed)
EDP at bedside  Pt nonverbal

## 2021-07-04 NOTE — ED Notes (Signed)
Informed RN bed assigned 

## 2021-07-04 NOTE — ED Notes (Signed)
Ultrasound at bedside

## 2021-07-04 NOTE — ED Triage Notes (Signed)
Pt to ED [facility] from home Hx dementia, per family does not speak in full sentences Family reports more altered than baseline Family unsure of timing of mental status change Hx stroke, leaning to R  Hx frequent UTIs, last UTI not fully treated per family  T 101.8, BP 144/76 last BP, a fib on 12 lead, HR 100-130 89% on RA, 97% on 3L. Received '4mg'$  zofran Im for 1 episode vomiting in ambulance Multiple unsuccessful IV attempts by EMS  Pt found to be 88% on RA in ED, placed on 2L

## 2021-07-04 NOTE — H&P (Signed)
History and Physical    [name] [id number] DOB: 1926-07-13 DOA: 07/04/2021  PCP: Baxter Hire, MD (Confirm with patient/family/NH records and if not entered, this has to be entered at [facility] point of entry) Patient coming from: Home  I have personally briefly reviewed patient's old medical records in [place]  Chief Complaint: Belly hurts  HPI: Robin Morales is a 87 y.o. female with medical history significant of chronic A. fib, CAD, CVA, HTN, HLD, presented with new onset of abdominal pain and fever.  Patient somnolent, most history provided by patient daughter at bedside.  Patient has not had history of recurrent cholecystitis had 2-3 episodes last 3 years.  We will treated conservatively.  Today, at lunchtime, patient took 2 bites of barbecue then started to feel started onset of right-sided abdominal pain 10/10.  No throwing up or diarrhea.  Daughter has been checking patient temperature every day since last year for recurrent UTI but this morning her temperature was 97.2 at home. ED Course: Blood pressure borderline low, fever 102.9, CT abdomen compatible with acute on chronic cholecystitis.  Blood work AST 310, ALT at 99, bilirubin 1.7, WBC 11.5.  ED physician to patient family, patient is DNR/DNI, do not wish to have any invasive procedures.  Review of Systems: Unable to perform, patient somnolent.  Past Medical History:  Diagnosis Date   A-fib ([place]) 03/28/2016   Arthritis    CAD (coronary artery disease) 07/10/2014   Chronic ischemic left PCA stroke 04/14/2016   Overview:  Left PCA   CVA (cerebral vascular accident) ([place])    Diabetes mellitus without complication ([place])    Gastroesophageal reflux disease without esophagitis 07/05/2015   Hypercholesterolemia    Hyperlipidemia 07/10/2014   Hypertension    MI (myocardial infarction) ([place])    TIA (transient ischemic attack) 03/28/2016   Type 2 diabetes mellitus with diabetic nephropathy ([place]) 01/14/2016    Past  Surgical History:  Procedure Laterality Date   HIP ARTHROPLASTY       reports that she has never smoked. She has never used smokeless tobacco. She reports that she does not drink alcohol and does not use drugs.  Allergies  Allergen Reactions   Gabapentin     Other reaction(s): Dizziness   Keflex [Cephalexin] Itching    Family History  Problem Relation Age of Onset   Hypertension Daughter    Diabetes Sister    Prostate cancer Neg Hx      Prior to Admission medications   Medication Sig Start Date End Date Taking? Authorizing Provider  Acetaminophen 325 MG CAPS Take 650 mg by mouth every 6 (six) hours as needed for mild pain.    Yes [provider]  alendronate (FOSAMAX) 70 MG tablet Take 70 mg by mouth every Thursday.  03/12/18  Yes [provider]  amLODipine (NORVASC) 2.5 MG tablet Take 1 tablet (2.5 mg total) by mouth daily. 12/08/18 07/04/21 Yes Gladstone Lighter, MD  apixaban (ELIQUIS) 2.5 MG TABS tablet Take 1 tablet (2.5 mg total) by mouth 2 (two) times daily. 03/29/16  Yes Max Sane, MD  atorvastatin (LIPITOR) 40 MG tablet Take 1 tablet (40 mg total) by mouth daily. Patient taking differently: Take 40 mg by mouth at bedtime. 04/11/20  Yes Enzo Bi, MD  Chlorpheniramine-Acetaminophen (CORICIDIN HBP COLD/FLU PO) Take 1 tablet by mouth daily as needed (cold and flu symptoms).    Yes [provider]  cholecalciferol (VITAMIN D3) 25 MCG (1000 UT) tablet Take 2,000 Units by  mouth daily.    Yes [provider]  metoprolol (LOPRESSOR) 50 MG tablet Take 50 mg by mouth 2 (two) times daily.    Yes [provider]  omeprazole (PRILOSEC) 40 MG capsule Take 1 capsule by mouth daily. 03/18/16  Yes [provider]  risperiDONE (RISPERDAL) 0.25 MG tablet Take 0.25 mg by mouth 2 (two) times daily.   Yes [provider]  sodium bicarbonate 325 MG tablet Take 325 mg by mouth 4 (four) times daily.   Yes [provider]   vitamin B-12 (CYANOCOBALAMIN) 1000 MCG tablet Take 1,000 mcg by mouth daily.   Yes [provider]    Physical Exam: Vitals:   07/04/21 1534 07/04/21 1600 07/04/21 1630 07/04/21 1730  BP: 128/62 110/61 (!) 117/57 (!) 110/57  Pulse: 95 83 92 86  Resp: '19 12 19 '$ (!) 21  Temp: 99.9 F (37.7 C)     TempSrc: Axillary     SpO2: 92% 92% 95% 96%  Weight:      Height:        Constitutional: NAD, calm, comfortable Vitals:   07/04/21 1534 07/04/21 1600 07/04/21 1630 07/04/21 1730  BP: 128/62 110/61 (!) 117/57 (!) 110/57  Pulse: 95 83 92 86  Resp: '19 12 19 '$ (!) 21  Temp: 99.9 F (37.7 C)     TempSrc: Axillary     SpO2: 92% 92% 95% 96%  Weight:      Height:       Eyes: PERRL, lids and conjunctivae normal ENMT: Mucous membranes are dry. Posterior pharynx clear of any exudate or lesions.Normal dentition.  Neck: normal, supple, no masses, no thyromegaly Respiratory: clear to auscultation bilaterally, no wheezing, no crackles. Normal respiratory effort. No accessory muscle use.  Cardiovascular: Regular rate and rhythm, no murmurs / rubs / gallops. No extremity edema. 2+ pedal pulses. No carotid bruits.  Abdomen: tenderness on RUQ, no rebound no guarding, no masses palpated. No hepatosplenomegaly. Bowel sounds positive.  Musculoskeletal: no clubbing / cyanosis. No joint deformity upper and lower extremities. Good ROM, no contractures. Normal muscle tone.  Skin: no rashes, lesions, ulcers. No induration Neurologic: No facial droops, moving all limbs, following simple commands. Psychiatric: Arousable, lethargic, confused.    Labs on Admission: I have personally reviewed following labs and imaging studies  CBC: Recent Labs  Lab 07/04/21 1346  WBC 11.5*  NEUTROABS 10.7*  HGB 12.2  HCT 37.2  MCV 89.4  PLT 123456   Basic Metabolic Panel: Recent Labs  Lab 07/04/21 1346  NA 141  K 3.7  CL 106  CO2 25  GLUCOSE 177*  BUN 31*  CREATININE 1.27*  CALCIUM 9.0    GFR: Estimated Creatinine Clearance: 26.6 mL/min (A) (by C-G formula based on SCr of 1.27 mg/dL (H)). Liver Function Tests: Recent Labs  Lab 07/04/21 1346  AST 310*  ALT 99*  ALKPHOS 259*  BILITOT 1.7*  PROT 6.5  ALBUMIN 3.4*   Recent Labs  Lab 07/04/21 1346  LIPASE 23   No results for input(s): AMMONIA in the last 168 hours. Coagulation Profile: Recent Labs  Lab 07/04/21 1346  INR 1.3*   Cardiac Enzymes: No results for input(s): CKTOTAL, CKMB, CKMBINDEX, TROPONINI in the last 168 hours. BNP (last 3 results) No results for input(s): PROBNP in the last 8760 hours. HbA1C: No results for input(s): HGBA1C in the last 72 hours. CBG: No results for input(s): GLUCAP in the last 168 hours. Lipid Profile: No results for input(s): CHOL, HDL, LDLCALC, TRIG, CHOLHDL,  LDLDIRECT in the last 72 hours. Thyroid Function Tests: No results for input(s): TSH, T4TOTAL, FREET4, T3FREE, THYROIDAB in the last 72 hours. Anemia Panel: No results for input(s): VITAMINB12, FOLATE, FERRITIN, TIBC, IRON, RETICCTPCT in the last 72 hours. Urine analysis:    Component Value Date/Time   COLORURINE YELLOW (A) 04/10/2020 2057   APPEARANCEUR CLEAR (A) 04/10/2020 2057   APPEARANCEUR Cloudy (A) 05/14/2018 1333   LABSPEC 1.014 04/10/2020 2057   PHURINE 6.0 04/10/2020 2057   GLUCOSEU 50 (A) 04/10/2020 2057   HGBUR LARGE (A) 04/10/2020 2057   BILIRUBINUR NEGATIVE 04/10/2020 2057   BILIRUBINUR Negative 05/14/2018 1333   KETONESUR NEGATIVE 04/10/2020 2057   PROTEINUR 30 (A) 04/10/2020 2057   NITRITE NEGATIVE 04/10/2020 2057   LEUKOCYTESUR NEGATIVE 04/10/2020 2057    Radiological Exams on Admission: CT Head Wo Contrast  Result Date: 07/04/2021 CLINICAL DATA:  Dementia with mental status altered from baseline. EXAM: CT HEAD WITHOUT CONTRAST TECHNIQUE: Contiguous axial images were obtained from the base of the skull through the vertex without intravenous contrast. COMPARISON:  04/10/2020 FINDINGS:  Brain: The brainstem, cerebellum, cerebral peduncles, thalami, basal ganglia, basilar cisterns, and ventricular system appear within normal limits. Periventricular white matter and corona radiata hypodensities favor chronic ischemic microvascular white matter disease. No intracranial hemorrhage, mass lesion, or acute CVA. Vascular: There is atherosclerotic calcification of the cavernous carotid arteries bilaterally. Skull: Unremarkable Sinuses/Orbits: Unremarkable Other: No supplemental non-categorized findings. IMPRESSION: 1. No acute intracranial findings. 2. Periventricular white matter and corona radiata hypodensities favor chronic ischemic microvascular white matter disease. Electronically Signed   By: Van Clines M.D.   On: 07/04/2021 14:47   DG Chest Port 1 View  Result Date: 07/04/2021 CLINICAL DATA:  Altered mental status, possible sepsis. EXAM: PORTABLE CHEST 1 VIEW COMPARISON:  12/06/2018. FINDINGS: Trachea is midline. Heart size stable. Thoracic aorta is calcified. Mild interstitial prominence and indistinctness bilaterally. Possible trace left pleural fluid. Right costophrenic angle is not included in its entirety. IMPRESSION: 1. Question mild pulmonary edema and tiny left pleural effusion. 2.  Aortic atherosclerosis (ICD10-I70.0). Electronically Signed   By: Lorin Picket M.D.   On: 07/04/2021 14:02   US ABDOMEN LIMITED RUQ (LIVER/GB)  Result Date: 07/04/2021 CLINICAL DATA:  Transaminitis, type II diabetes mellitus, coronary artery disease post MI, hypertension. Past history of emphysematous cholecystitis EXAM: ULTRASOUND ABDOMEN LIMITED RIGHT UPPER QUADRANT COMPARISON:  CT abdomen and pelvis 12/06/2018 FINDINGS: Gallbladder: Large calculus 20 mm diameter within contracted gallbladder. Associated gallbladder wall thickening. Minimal pericholecystic fluid. No sonographic Murphy sign. Common bile duct: Diameter: 11 mm, measured 7 mm diameter on prior CT Liver: No hepatic mass or  nodularity. Normal echogenicity. No definite intrahepatic biliary dilatation. Portal vein is patent on color Doppler imaging with normal direction of blood flow towards the liver. Other: No RIGHT upper quadrant free fluid. IMPRESSION: Cholelithiasis with chronic gallbladder wall thickening and minimal pericholecystic fluid though no definite sonographic Percell Miller sign is identified. This could reflect acute or chronic cholecystitis. Biliary dilatation, CBD 11 mm increased from previous exam. Electronically Signed   By: Lavonia Dana M.D.   On: 07/04/2021 16:30    EKG: Independently reviewed.  Chronic A. fib  Assessment/Plan Active Problems:   Sepsis ([place])  (please populate well all problems here in Problem List. (For example, if patient is on BP meds at home and you resume or decide to hold them, it is a problem that needs to be her. Same for CAD, COPD, HLD and so on)  Sepsis -Evidenced by  tachycardia, fever, elevated lactate.  Source is acute/recurrent cholecystitis -Conservative management, IV fluid, n.p.o. and IV antibiotics ceftriaxone and Flagyl. -Discussed with daughter at bedside, if no improvement in the next 1 to 2 days, consider hospice.  Acute metabolic encephalopathy -Secondary to sepsis, avoid sedation medications. -Treat infections then reevaluate -CT head reassuring.  Chronic A. Fib -Rate controlled, hold metoprolol p.o., switch to Lopressor as needed for HR> 100. -Continue Eliquis.  HTN -Hold home BP meds, as needed metoprolol for now.  Acute transaminitis -Hold statin   DVT prophylaxis: Eliquis Code Status: DNR/DNI Family Communication: Daughter at bedside Disposition Plan: Expect more than 2 midnight hospital stay Consults called: None Admission status: Telemetry admission   Lequita Halt MD [facility] Pager [phone]  07/04/2021, 6:16 PM

## 2021-07-04 NOTE — ED Notes (Signed)
Urine not collected yet. Per EDP, ok not to in and out cath pt for now. Pt has purewick. 2 daughters at bedside. VS stable. Pt continues to rest with eyes closed, not verbally responsive.

## 2021-07-04 NOTE — ED Notes (Signed)
Will administer rectal tylenol etc once have help for peri care and to cath pt.

## 2021-07-04 NOTE — ED Provider Notes (Signed)
[facility] Emergency Department Provider Note  ____________________________________________   Event Date/Time   First MD Initiated Contact with Patient 07/04/21 1323     (approximate)  I have reviewed the triage vital signs and the nursing notes.   HISTORY  Chief Complaint Sepsis alert EMS (Altered mental status, hx dementia/Untreated UTI/)   HPI Robin Morales is a 87 y.o. female with medical history significant for A. fib on Eliquis, CAD, history of CVA, HTN, diabetes on glipizide and CKD 4 and some mild dementia who presents EMS from home after family ported she seemed much more confused than usual today.  They state she is very hard of hearing and will often only speak to family but seems much more out of breath this morning.  She reportedly does need significant help with ADLs and is not typically oriented.  No witnessed recent falls or injuries although she was recently treated for UTI.  Patient is unable provide any history arrival secondary to altered mental status.  Patient's sister did confirm patient is a DNR.         Past Medical History:  Diagnosis Date   A-fib (HCC) 03/28/2016   Arthritis    CAD (coronary artery disease) 07/10/2014   Chronic ischemic left PCA stroke 04/14/2016   Overview:  Left PCA   CVA (cerebral vascular accident) (HCC)    Diabetes mellitus without complication (HCC)    Gastroesophageal reflux disease without esophagitis 07/05/2015   Hypercholesterolemia    Hyperlipidemia 07/10/2014   Hypertension    MI (myocardial infarction) (HCC)    TIA (transient ischemic attack) 03/28/2016   Type 2 diabetes mellitus with diabetic nephropathy (HCC) 01/14/2016    Patient Active Problem List   Diagnosis Date Noted   Hypoglycemia due to type 2 diabetes mellitus (HCC) 04/11/2020   CKD (chronic kidney disease) stage 4, GFR 15-29 ml/min (HCC) 04/11/2020   Hypoglycemia 04/11/2020   Pressure injury of skin 12/04/2018   Goals of care,  counseling/discussion    Advanced care planning/counseling discussion    Sepsis (HCC) 12/03/2018   Acute emphysematous cholecystitis    Age-related osteoporosis without current pathological fracture 04/04/2017   Chronic ischemic left PCA stroke 04/14/2016   A-fib (HCC) 03/28/2016   TIA (transient ischemic attack) 03/28/2016   Type 2 diabetes mellitus with diabetic nephropathy (HCC) 01/14/2016   Type 2 diabetes mellitus with diabetic neuropathy (HCC) 01/14/2016   Gastroesophageal reflux disease without esophagitis 07/05/2015   CAD (coronary artery disease) 07/10/2014   Hyperlipidemia 07/10/2014   Hypertension 07/10/2014    Past Surgical History:  Procedure Laterality Date   HIP ARTHROPLASTY      Prior to Admission medications   Medication Sig Start Date End Date Taking? Authorizing Provider  Acetaminophen 325 MG CAPS Take 650 mg by mouth every 6 (six) hours as needed for mild pain.    Yes [provider]  alendronate (FOSAMAX) 70 MG tablet Take 70 mg by mouth every Thursday.  03/12/18  Yes [provider]  amLODipine (NORVASC) 2.5 MG tablet Take 1 tablet (2.5 mg total) by mouth daily. 12/08/18 07/04/21 Yes Enid Baas, MD  apixaban (ELIQUIS) 2.5 MG TABS tablet Take 1 tablet (2.5 mg total) by mouth 2 (two) times daily. 03/29/16  Yes Delfino Lovett, MD  atorvastatin (LIPITOR) 40 MG tablet Take 1 tablet (40 mg total) by mouth daily. Patient taking differently: Take 40 mg by mouth at bedtime. 04/11/20  Yes Darlin Priestly, MD  Chlorpheniramine-Acetaminophen (CORICIDIN HBP COLD/FLU PO) Take 1  tablet by mouth daily as needed (cold and flu symptoms).    Yes [provider]  cholecalciferol (VITAMIN D3) 25 MCG (1000 UT) tablet Take 2,000 Units by mouth daily.    Yes [provider]  metoprolol (LOPRESSOR) 50 MG tablet Take 50 mg by mouth 2 (two) times daily.    Yes [provider]  omeprazole (PRILOSEC) 40 MG capsule Take 1 capsule by mouth daily. 03/18/16   Yes [provider]  risperiDONE (RISPERDAL) 0.25 MG tablet Take 0.25 mg by mouth 2 (two) times daily.   Yes [provider]  sodium bicarbonate 325 MG tablet Take 325 mg by mouth 4 (four) times daily.   Yes [provider]  vitamin B-12 (CYANOCOBALAMIN) 1000 MCG tablet Take 1,000 mcg by mouth daily.   Yes [provider]    Allergies Gabapentin and Keflex [cephalexin]  Family History  Problem Relation Age of Onset   Hypertension Daughter    Diabetes Sister    Prostate cancer Neg Hx     Social History Social History   Tobacco Use   Smoking status: Never   Smokeless tobacco: Never  Substance Use Topics   Alcohol use: No   Drug use: No    Review of Systems  Review of Systems  Unable to perform ROS: Mental status change     ____________________________________________   PHYSICAL EXAM:  VITAL SIGNS: ED Triage Vitals [07/04/21 1322]  Enc Vitals Group     BP      Pulse      Resp      Temp      Temp src      SpO2      Weight 155 lb (70.3 kg)     Height $Remov'5\' 4"'NfzcZL$  (1.626 m)     Head Circumference      Peak Flow      Pain Score      Pain Loc      Pain Edu?      Excl. in [place]?    Vitals:   07/04/21 1534 07/04/21 1600  BP: 128/62 110/61  Pulse: 95 83  Resp: 19 12  Temp: 99.9 F (37.7 C)   SpO2: 92% 92%   Physical Exam Vitals and nursing note reviewed.  Constitutional:      General: She is in acute distress.     Appearance: She is well-developed. She is ill-appearing.  HENT:     Head: Normocephalic and atraumatic.     Right Ear: External ear normal.     Left Ear: External ear normal.     Nose: Nose normal.     Mouth/Throat:     Mouth: Mucous membranes are dry.  Eyes:     Conjunctiva/sclera: Conjunctivae normal.  Cardiovascular:     Rate and Rhythm: Normal rate and regular rhythm.     Heart sounds: No murmur heard. Pulmonary:     Effort: Pulmonary effort is normal. Tachypnea present. No respiratory distress.      Breath sounds: Normal breath sounds.  Abdominal:     Palpations: Abdomen is soft.     Comments: Difficult to assess for TTP given patient seems minimally responsive  Musculoskeletal:     Cervical back: Neck supple.  Skin:    General: Skin is warm and dry.     Capillary Refill: Capillary refill takes 2 to 3 seconds.  Neurological:     Mental Status: She is lethargic and confused.     Gait: Gait normal.  Left pupil is 1.5 mm and reactive and right pupil is 1 mm reactive ____________________________________________   LABS (all labs ordered are listed, but only abnormal results are displayed)  Labs Reviewed  LACTIC ACID, PLASMA - Abnormal; Notable for the following components:      Result Value   Lactic Acid, Venous 2.5 (*)    All other components within normal limits  LACTIC ACID, PLASMA - Abnormal; Notable for the following components:   Lactic Acid, Venous 2.5 (*)    All other components within normal limits  COMPREHENSIVE METABOLIC PANEL - Abnormal; Notable for the following components:   Glucose, Bld 177 (*)    BUN 31 (*)    Creatinine, Ser 1.27 (*)    Albumin 3.4 (*)    AST 310 (*)    ALT 99 (*)    Alkaline Phosphatase 259 (*)    Total Bilirubin 1.7 (*)    GFR, Estimated 39 (*)    All other components within normal limits  CBC WITH DIFFERENTIAL/PLATELET - Abnormal; Notable for the following components:   WBC 11.5 (*)    Neutro Abs 10.7 (*)    Lymphs Abs 0.4 (*)    All other components within normal limits  PROTIME-INR - Abnormal; Notable for the following components:   Prothrombin Time 16.2 (*)    INR 1.3 (*)    All other components within normal limits  BRAIN NATRIURETIC PEPTIDE - Abnormal; Notable for the following components:   B Natriuretic Peptide 296.0 (*)    All other components within normal limits  RESP PANEL BY RT-PCR (FLU A&B, COVID) ARPGX2  CULTURE, BLOOD (SINGLE)  URINE CULTURE  APTT  LIPASE, BLOOD  URINALYSIS, COMPLETE (UACMP) WITH MICROSCOPIC    ____________________________________________  EKG  A. fib with a rate of 93, normal axis, unremarkable intervals without clearance of acute ischemia or significant arrhythmia. ____________________________________________  RADIOLOGY  ED MD interpretation: CT head shows periventricular white matter and corona radiata hypodensities likely chronic ischemic microvascular disease.  No clear other acute intracranial process.  Chest x-ray shows possible very mild edema without clear focal consolidation, pneumothorax, or other significant acute intrathoracic process.  Official radiology report(s): CT Head Wo Contrast  Result Date: 07/04/2021 CLINICAL DATA:  Dementia with mental status altered from baseline. EXAM: CT HEAD WITHOUT CONTRAST TECHNIQUE: Contiguous axial images were obtained from the base of the skull through the vertex without intravenous contrast. COMPARISON:  04/10/2020 FINDINGS: Brain: The brainstem, cerebellum, cerebral peduncles, thalami, basal ganglia, basilar cisterns, and ventricular system appear within normal limits. Periventricular white matter and corona radiata hypodensities favor chronic ischemic microvascular white matter disease. No intracranial hemorrhage, mass lesion, or acute CVA. Vascular: There is atherosclerotic calcification of the cavernous carotid arteries bilaterally. Skull: Unremarkable Sinuses/Orbits: Unremarkable Other: No supplemental non-categorized findings. IMPRESSION: 1. No acute intracranial findings. 2. Periventricular white matter and corona radiata hypodensities favor chronic ischemic microvascular white matter disease. Electronically Signed   By: Van Clines M.D.   On: 07/04/2021 14:47   DG Chest Port 1 View  Result Date: 07/04/2021 CLINICAL DATA:  Altered mental status, possible sepsis. EXAM: PORTABLE CHEST 1 VIEW COMPARISON:  12/06/2018. FINDINGS: Trachea is midline. Heart size stable. Thoracic aorta is calcified. Mild interstitial prominence  and indistinctness bilaterally. Possible trace left pleural fluid. Right costophrenic angle is not included in its entirety. IMPRESSION: 1. Question mild pulmonary edema and tiny left pleural effusion. 2.  Aortic atherosclerosis (ICD10-I70.0). Electronically Signed   By: Lorin Picket M.D.   On: 07/04/2021  14:02    ____________________________________________   PROCEDURES  Procedure(s) performed (including Critical Care):  .Critical Care  Date/Time: 07/04/2021 4:27 PM Performed by: Lucrezia Starch, MD Authorized by: Lucrezia Starch, MD   Critical care provider statement:    Critical care time (minutes):  45   Critical care time was exclusive of:  Separately billable procedures and treating other patients   Critical care was necessary to treat or prevent imminent or life-threatening deterioration of the following conditions:  Sepsis   Critical care was time spent personally by me on the following activities:  Discussions with consultants, evaluation of patient's response to treatment, examination of patient, ordering and performing treatments and interventions, ordering and review of laboratory studies, ordering and review of radiographic studies, pulse oximetry, re-evaluation of patient's condition, obtaining history from patient or surrogate and review of old charts   ____________________________________________   INITIAL IMPRESSION / ASSESSMENT AND PLAN / ED COURSE        Patient presents with above to history exam for assessment of cute onset of altered mental status per family i.e. daughter is here with her on arrival.  Patient is unemployed and history secondary to altered mental status.  She is noted to be febrile on arrival at 102.9 and tachypneic at 24 otherwise stable vital signs.  Initial differential is quite broad and includes CVA, metabolic derangements, acute infectious process, thyroid derangements, SAH and endocrine derangements.  No history or exam features to  suggest acute trauma.  CT head shows no clear acute cranial process including evidence of SAH or CVA.  Chest x-ray shows possible mild early pulm edema and tiny left pleural effusion without focal consolidation, pneumothorax, rib fracture or overt consolidation.  COVID influenza PCR is negative.  CMP shows a glucose of 177 and is otherwise remarkable for an AST of 310, ALT of 99, alk phos of 259 and a T bili of 1.7 concerning for cholestatic process.  Lactic acid is still elevated 2.5.  CBC remarkable for leukocytosis today was 11.5 evidence of acute anemia.  BNP is mildly elevated to 296 consistent with early edema, I am concerned patient may be septic possibly from cholangitis or choledocholithiasis for large volume resuscitation administer single fluid bolus antibiotics plan to reassess.  Lipase not elevated at 23.  We will plan to obtain a UA as well to assess for possible cystitis and right upper quadrant ultrasound to assess, bile duct and gallbladder.  Explained the concern for possible infection including life-threatening infection the family at bedside.  They state the patient would likely not want surgery at this point in her life they are amenable possible treatment with fluids and antibiotics.  Plan is to discuss further once more information is obtained with right quadrant ultrasound.  Care patient signed over to oncoming fat approximately 1500.  Plan is to follow-up right upper quadrant ultrasound and reassess patient and discuss with family goals of care further.  ____________________________________________   FINAL CLINICAL IMPRESSION(S) / ED DIAGNOSES  Final diagnoses:  Transaminitis  Sepsis, due to unspecified organism, unspecified whether acute organ dysfunction present (HCC)  Altered mental status, unspecified altered mental status type  Goals of care, counseling/discussion    Medications  acetaminophen (TYLENOL) suppository 650 mg (650 mg Rectal Given 07/04/21 1428)   ondansetron (ZOFRAN) injection 4 mg (4 mg Intravenous Given 07/04/21 1438)  cefTRIAXone (ROCEPHIN) 1 g in sodium chloride 0.9 % 100 mL IVPB (0 g Intravenous Stopped 07/04/21 1512)  lactated ringers bolus 1,000 mL (1,000 mLs  Intravenous New Bag/Given 07/04/21 1515)  metroNIDAZOLE (FLAGYL) IVPB 500 mg (500 mg Intravenous New Bag/Given 07/04/21 1518)     ED Discharge Orders     None        Note:  This document was prepared using Dragon voice recognition software and may include unintentional dictation errors.    Lucrezia Starch, MD 07/04/21 [phone]

## 2021-07-04 NOTE — ED Notes (Signed)
EDP at bedside  

## 2021-07-05 ENCOUNTER — Inpatient Hospital Stay: Payer: Medicare HMO

## 2021-07-05 LAB — CBC WITH DIFFERENTIAL/PLATELET
Abs Immature Granulocytes: 0.09 10*3/uL — ABNORMAL HIGH (ref 0.00–0.07)
Basophils Absolute: 0.1 10*3/uL (ref 0.0–0.1)
Basophils Relative: 1 %
Eosinophils Absolute: 0 10*3/uL (ref 0.0–0.5)
Eosinophils Relative: 0 %
HCT: 33.1 % — ABNORMAL LOW (ref 36.0–46.0)
Hemoglobin: 10.5 g/dL — ABNORMAL LOW (ref 12.0–15.0)
Immature Granulocytes: 1 %
Lymphocytes Relative: 2 %
Lymphs Abs: 0.4 10*3/uL — ABNORMAL LOW (ref 0.7–4.0)
MCH: 29.3 pg (ref 26.0–34.0)
MCHC: 31.7 g/dL (ref 30.0–36.0)
MCV: 92.5 fL (ref 80.0–100.0)
Monocytes Absolute: 1.2 10*3/uL — ABNORMAL HIGH (ref 0.1–1.0)
Monocytes Relative: 7 %
Neutro Abs: 15.1 10*3/uL — ABNORMAL HIGH (ref 1.7–7.7)
Neutrophils Relative %: 89 %
Platelets: 187 10*3/uL (ref 150–400)
RBC: 3.58 MIL/uL — ABNORMAL LOW (ref 3.87–5.11)
RDW: 14.1 % (ref 11.5–15.5)
Smear Review: NORMAL
WBC Morphology: INCREASED
WBC: 16.9 10*3/uL — ABNORMAL HIGH (ref 4.0–10.5)
nRBC: 0 % (ref 0.0–0.2)

## 2021-07-05 LAB — COMPREHENSIVE METABOLIC PANEL
ALT: 152 U/L — ABNORMAL HIGH (ref 0–44)
AST: 190 U/L — ABNORMAL HIGH (ref 15–41)
Albumin: 2.7 g/dL — ABNORMAL LOW (ref 3.5–5.0)
Alkaline Phosphatase: 193 U/L — ABNORMAL HIGH (ref 38–126)
Anion gap: 7 (ref 5–15)
BUN: 35 mg/dL — ABNORMAL HIGH (ref 8–23)
CO2: 26 mmol/L (ref 22–32)
Calcium: 8.2 mg/dL — ABNORMAL LOW (ref 8.9–10.3)
Chloride: 110 mmol/L (ref 98–111)
Creatinine, Ser: 1.39 mg/dL — ABNORMAL HIGH (ref 0.44–1.00)
GFR, Estimated: 35 mL/min — ABNORMAL LOW (ref >60)
Glucose, Bld: 136 mg/dL — ABNORMAL HIGH (ref 70–99)
Potassium: 4.5 mmol/L (ref 3.5–5.1)
Sodium: 143 mmol/L (ref 135–145)
Total Bilirubin: 1.9 mg/dL — ABNORMAL HIGH (ref 0.3–1.2)
Total Protein: 5.4 g/dL — ABNORMAL LOW (ref 6.5–8.1)

## 2021-07-05 MED ORDER — PIPERACILLIN-TAZOBACTAM 3.375 G IVPB
3.3750 g | Freq: Three times a day (TID) | INTRAVENOUS | Status: DC
  Administered 2021-07-05 – 2021-07-06 (×2): 3.375 g via INTRAVENOUS
  Filled 2021-07-05 (×2): qty 50

## 2021-07-05 MED ORDER — METOPROLOL TARTRATE 5 MG/5ML IV SOLN
2.5000 mg | Freq: Four times a day (QID) | INTRAVENOUS | Status: DC
  Administered 2021-07-06 – 2021-07-07 (×5): 2.5 mg via INTRAVENOUS
  Filled 2021-07-05 (×6): qty 5

## 2021-07-05 MED ORDER — LACTATED RINGERS IV SOLN: INTRAVENOUS | Status: DC

## 2021-07-05 MED ORDER — PANTOPRAZOLE SODIUM 40 MG IV SOLR
40.0000 mg | INTRAVENOUS | Status: DC
  Administered 2021-07-06 – 2021-07-07 (×2): 40 mg via INTRAVENOUS
  Filled 2021-07-05 (×2): qty 40

## 2021-07-05 NOTE — TOC Progression Note (Signed)
Transition of Care [facility]) - Progression Note    Patient Details  Name: Robin Morales MRN: [id number] Date of Birth: [birth year]  Transition of Care [facility]) CM/SW [place], RN Phone Number: 07/05/2021, 12:03 PM  Clinical Narrative:   Patient lives at home with her daughter.  Daughter's only concern about patient returning home is to be able to walk with her walker with minimal assistance, as daughter is concerned about caring for her at home otherwise.  Daughter has hired a Community education officer to assist on occasion with taking patient to appointments and other personal care.  Patient and daughter have no concerns with getting patient to appointments, getting medications or taking medications as directed.    No other concerns noted by patient or daughters at this time, TOC contact information given, tOC will follow for needs.    Expected Discharge Plan: Gate Barriers to Discharge: Continued Medical Work up  Expected Discharge Plan and Services Expected Discharge Plan: [place]   Discharge Planning Services: CM Consult Post Acute Care Choice: [place] arrangements for the past 2 months: Single Family Home                                       Social Determinants of Health (SDOH) Interventions    Readmission Risk Interventions Readmission Risk Prevention Plan 12/08/2018  Transportation Screening Complete  PCP or Specialist Appt within 5-7 Days Complete  Home Care Screening Complete  Medication Review (RN CM) Complete  Some recent data might be hidden

## 2021-07-05 NOTE — Progress Notes (Signed)
   07/04/21 2204  MEWS Score  MEWS Temp 0  MEWS Systolic 0  MEWS Pulse 0  MEWS RR 0  MEWS LOC 2  MEWS Score 2  MEWS Score Color Yellow  Pt has been  consistently yellow. MD aware.

## 2021-07-05 NOTE — Consult Note (Signed)
Pharmacy Antibiotic Note  Robin Morales is a 87 y.o. female admitted on 07/04/2021 with acute on chronic cholecystitis. Initiatly started on ceftriaxone + metronidazole 7/11. Due to worsening fevers, pharmacy has been consulted for Zosyn dosing.  Plan: Zosyn 3.375g IV q8h (4 hour infusion).  Height: '5\' 2"'$  (157.5 cm) Weight: 63.3 kg (139 lb 8 oz) IBW/kg (Calculated) : 50.1  Temp (24hrs), Avg:99 F (37.2 C), Min:97.4 F (36.3 C), Max:101.6 F (38.7 C)  Recent Labs  Lab 07/04/21 1346 07/04/21 1530 07/05/21 0439  WBC 11.5*  --  16.9*  CREATININE 1.27*  --  1.39*  LATICACIDVEN 2.5* 2.5*  --     Estimated Creatinine Clearance: 22.1 mL/min (A) (by C-G formula based on SCr of 1.39 mg/dL (H)).    Allergies  Allergen Reactions   Gabapentin     Other reaction(s): Dizziness   Keflex [Cephalexin] Itching    Antimicrobials this admission: 7/11 ceftriaxone >> 7/12 7/11 metronidazole >> 7/12 7/12 Zosyn >>  Dose adjustments this admission: N/a  Microbiology results: 7/11 BCx: NGTD 7/11 UCx: sent   Thank you for allowing pharmacy to be a part of this patient's care.  Dorothe Pea, PharmD, BCPS Clinical Pharmacist   07/05/2021 6:16 PM

## 2021-07-05 NOTE — Plan of Care (Signed)

## 2021-07-05 NOTE — Progress Notes (Signed)
   07/05/21 1730  Assess: MEWS Score  Temp (!) 101.6 F (38.7 C)  BP (!) 146/80  Pulse Rate (!) 113  Resp 20  Level of Consciousness Alert  SpO2 95 %  O2 Device Nasal Cannula  O2 Flow Rate (L/min) 2 L/min  Assess: MEWS Score  MEWS Temp 2  MEWS Systolic 0  MEWS Pulse 2  MEWS RR 0  MEWS LOC 0  MEWS Score 4  MEWS Score Color Red  Assess: if the MEWS score is Yellow or Red  Were vital signs taken at a resting state? Yes  Focused Assessment Change from prior assessment (see assessment flowsheet)  Does the patient meet 2 or more of the SIRS criteria? Yes  Does the patient have a confirmed or suspected source of infection? Yes  Provider and Rapid Response Notified? Yes (Dr. Posey Pronto present, ICU RN, CHG RN)  MEWS guidelines implemented *See Row Information* Yes  Treat  MEWS Interventions Administered prn meds/treatments  Pain Scale Faces  Faces Pain Scale 0  Breathing 0  Negative Vocalization 0  Facial Expression 0  Body Language 0  Consolability 0  PAINAD Score 0  Take Vital Signs  Increase Vital Sign Frequency  Red: Q 1hr X 4 then Q 4hr X 4, if remains red, continue Q 4hrs  Escalate  MEWS: Escalate Red: discuss with charge nurse/RN and provider, consider discussing with RRT  Notify: Charge Nurse/RN  Name of Charge Nurse/RN Notified Amanda, RN  Date Charge Nurse/RN Notified 07/05/21  Time Charge Nurse/RN Notified 1730  Notify: Provider  Provider Name/Title Dr. Berle Mull  Date Provider Notified 07/05/21  Time Provider Notified 1730  Notification Type Face-to-face (Dr. at bedside)  Notification Reason Change in status (Temp and Tachycardia)  Provider response At bedside  Date of Provider Response 07/05/21  Time of Provider Response 1730  Notify: Rapid Response  Name of Rapid Response RN Notified Megan F  ICU RN  Date Rapid Response Notified 07/05/21  Time Rapid Response Notified [id number]  Document  Patient Outcome Other (Comment) (continue to monitor)  Progress note  created (see row info) Yes  Assess: SIRS CRITERIA  SIRS Temperature  1  SIRS Pulse 1  SIRS Respirations  0  SIRS WBC 0  SIRS Score Sum  2

## 2021-07-05 NOTE — Progress Notes (Addendum)
[facility] Progress Note  Patient: Robin Morales    VXY:[id number]  DOA: 07/04/2021     Date of Service: the patient was seen and examined on 07/05/2021  Brief hospital course: PMH of  chronic A. fib, CAD, CVA, HTN, HLD.  Presents with abdominal pain found to have acute on chronic cholecystitis. Currently plan is IV antibiotics and monitor for improvement, continue to engage with the family regarding goals of care.  Subjective: Lethargic.  Denies any acute complaint.  No nausea nor vomiting.  No acute events overnight.  No BM so far reported.  Assessment and Plan: 1.  Sepsis, present on admission secondary to acute on chronic cholecystitis Met SIRS criteria on admission with tachycardia, fever Currently conservative measures for cholecystitis. Family refuses surgical intervention and patient will not be a good candidate for any intervention regardless. Continue with IV antibiotics. Remains NPO. Continue with IV fluids. If there is no improvement in patient's condition would recommend hospice.  Addendum: Change Antibiotics to zosyn due to worsening fever.  Berle Mull 5:48 PM 07/05/2021    2.  Acute metabolic encephalopathy Secondary to sepsis. Currently avoiding psychotropic medication. CT head unremarkable for any acute stroke. Monitor  3.  Chronic A. Fib with RVR Currently rate controlled. Patient is on metoprolol will use as needed IV metoprolol. Will add scheduled lopressor with holding parameters.  On Eliquis which we will continue for now.  4.  HTN Blood pressure soft. Monitor.  5.  Dysphagia Likely secondary to lethargy from encephalopathy in the setting of sepsis. Also has dementia. Will get speech evaluation.  6.  GERD Continue PPI.  7. Goal of care D/w two daughters  Pt appears to be doing poorly with regard to the infection.  Daughter wants to continue current care.   Scheduled Meds:  apixaban  2.5 mg Oral BID   mouth rinse  15 mL Mouth  Rinse BID   [START ON 07/06/2021] pantoprazole (PROTONIX) IV  40 mg Intravenous Q24H   Continuous Infusions:  cefTRIAXone (ROCEPHIN)  IV 2 g (07/05/21 1038)   lactated ringers 50 mL/hr at 07/05/21 1513   metronidazole 500 mg (07/05/21 1515)   PRN Meds: acetaminophen, HYDROmorphone (DILAUDID) injection, metoprolol tartrate, ondansetron **OR** ondansetron (ZOFRAN) IV, senna-docusate  Body mass index is 25.51 kg/m.     DVT Prophylaxis:   apixaban (ELIQUIS) tablet 2.5 mg Start: 07/04/21 2200 apixaban (ELIQUIS) tablet 2.5 mg    Advance goals of care discussion: Pt is DNR.  Family Communication: family was present at bedside, at the time of interview.  The pt provided permission to discuss medical plan with the family. Opportunity was given to ask question and all questions were answered satisfactorily.   Data Reviewed: I have personally reviewed and interpreted daily labs, tele strips, imaging. Leukocytosis worsening from 11,000-17,000. Hemoglobin dropping down from 12.2-10.5. Creatinine worsening as well. LFT remained stable.  Physical Exam:  General: Appear in mild distress, no Rash; Oral Mucosa Clear, dry. no Abnormal Neck Mass Or lumps, Conjunctiva normal  Cardiovascular: S1 and S2 Present, no Murmur, Respiratory: good respiratory effort, Bilateral Air entry present and CTA, no Crackles, no wheezes Abdomen: Bowel Sound present, Soft and no tenderness Extremities: no Pedal edema Neurology: lethargic and not oriented to time, place, and person affect flat in affect. no new focal deficit Gait not checked due to patient safety concerns  Vitals:   07/05/21 0600 07/05/21 0818 07/05/21 1000 07/05/21 1201  BP:  (!) 142/64  137/66  Pulse:  76  (!)  105  Resp: _0 Temp:  (!) 97.4 F (36.3 C)  98.1 F (36.7 C)  TempSrc:    Oral  SpO2:  100% 90% 95%  Weight:      Height:        Disposition:  Status is: Inpatient  Remains inpatient appropriate because:IV treatments  appropriate due to intensity of illness or inability to take PO and Inpatient level of care appropriate due to severity of illness  Dispo: The patient is from: SNF              Anticipated d/c is to: SNF              Patient currently is not medically stable to d/c.   Difficult to place patient No        Time spent: 35 minutes. I reviewed all nursing notes, pharmacy notes, vitals, pertinent old records. I have discussed plan of care as described above with RN.  Author: Berle Mull, MD [facility] 07/05/2021 3:32 PM  To reach On-call, see care teams to locate the attending and reach out via [email or web address]. Between 7PM-7AM, please contact night-coverage If you still have difficulty reaching the attending provider, please page the [facility] (Director on Call) for [facility] on amion for assistance.

## 2021-07-05 NOTE — Progress Notes (Signed)
SLP Cancellation Note  Patient Details Name: Robin Morales MRN: [id number] DOB: September 14, 1926   Cancelled treatment:       Reason Eval/Treat Not Completed: Patient not medically ready;Medical issues which prohibited therapy (chart reviewed. NSG consulted.). Per MD note last evening, pt has Sepsis of acute/subacute cholecystitis. MD has ordered "Conservative management, IV fluid, N.P.O and IV antibiotics ceftriaxone and Flagyl.". This was discussed w/ family member.  ST services will f/u w/ BSE tomorrow, when appropriate for pt. Recommend frequent oral care for hygiene and stimulation of swallowing.      Orinda Kenner, MS, CCC-SLP Speech Language Pathologist Rehab Services [phone] [facility] 07/05/2021, 2:08 PM

## 2021-07-05 NOTE — Evaluation (Addendum)
Occupational Therapy Evaluation Patient Details Name: Robin Morales MRN: [id number] DOB: 02/12/1926 Today's Date: 07/05/2021    History of Present Illness Pt. is a 87 y.o. female who was admitted to [facility] with Acute Metabolic Encephalopathy secondary to Sepsis. PMHx includes: AFib, CAD, CVA, HTN, and HLD.   Clinical Impression   Pt. presents with weakness, fatigue, limited activity tolerance, and limited functional mobility which hinders her ability to complete basic ADL and IADL functioning. Pt. resides with her daughter. PTA, Pt. required assist with ADL, and IADL tasks from her daughter including: bathing, dressing, and grooming tasks. Pt. was able to feed herself with set-up. Pt. required assist with all meal preparation, medication management, home management/household tasks. Pt.'s daughter reports having a personal home care aide to assist when needed. Pt. Presented with a flat affect, and no eye contact. Pt. was assisted with repositioning for comfort. ROM was provided to the bilateral UEs. Pt. Could benefit from OT services for ADL training, A/E training, neuromuscular re-education,  and pt./caregiver education about positioning, home modification, and DME.     Follow Up Recommendations  SNF   Equipment Recommendations  None recommended by OT    Recommendations for Other Services       Precautions / Restrictions Precautions Precautions: None Restrictions Weight Bearing Restrictions: No      Mobility Bed Mobility Overal bed mobility: Needs Assistance             General bed mobility comments: MaxA for repositioning    Transfers                 General transfer comment: Deferred: Unable    Balance                                           ADL either performed or assessed with clinical judgement   ADL Overall ADL's : Needs assistance/impaired Eating/Feeding: NPO   Grooming: Total assistance   Upper Body Bathing: Total assistance    Lower Body Bathing: Total assistance   Upper Body Dressing : Total assistance   Lower Body Dressing: Total assistance                       Vision Baseline Vision/History:  (Continue to assess)       Perception     Praxis      Pertinent Vitals/Pain Pain Assessment: No/denies pain     Hand Dominance     Extremity/Trunk Assessment Upper Extremity Assessment Upper Extremity Assessment: Difficult to assess due to impaired cognition           Communication Communication Communication: Expressive difficulties;Receptive difficulties   Cognition Arousal/Alertness: Awake/alert   Overall Cognitive Status: Within Functional Limits for tasks assessed                                     General Comments       Exercises     Shoulder Instructions      Home Living Family/patient expects to be discharged to:: Private residence   Available Help at Discharge: Family Type of Home: House Home Access: Stairs to enter Technical brewer of Steps: 2   Home Layout: One level     Bathroom Shower/Tub: Walk-in shower;Door         Home Equipment: Wheelchair -  manual;Grab bars - tub/shower;Hand held shower head;Shower seat          Prior Functioning/Environment Level of Independence: Needs assistance    ADL's / Homemaking Assistance Needed: Pt.'s daughter performs all morning bathing, dressing, and grooming tasks for the pt. Pt. was able to feed herself with set-up.            OT Problem List: Decreased strength;Impaired UE functional use;Decreased knowledge of use of DME or AE;Decreased range of motion;Decreased cognition;Decreased activity tolerance;Decreased coordination      OT Treatment/Interventions: Self-care/ADL training;Neuromuscular education;Patient/family education;DME and/or AE instruction;Therapeutic activities;Therapeutic exercise    OT Goals(Current goals can be found in the care plan section) Acute Rehab OT Goals OT  Goal Formulation: Patient unable to participate in goal setting Time For Goal Achievement: 07/19/21 ADL Goals Pt Will Perform Grooming: with min assist Additional ADL Goal #1: Pt./caregivers will demonstrate independence with UE ROM, and repositioning.  OT Frequency: Min 1X/week   Barriers to D/C:            Co-evaluation              AM-PAC OT "6 Clicks" Daily Activity     Outcome Measure Help from another person eating meals?:  (NPO: liquid diet) Help from another person taking care of personal grooming?: Total Help from another person toileting, which includes using toliet, bedpan, or urinal?: Total Help from another person bathing (including washing, rinsing, drying)?: Total Help from another person to put on and taking off regular upper body clothing?: Total Help from another person to put on and taking off regular lower body clothing?: Total 6 Click Score: 5   End of Session    Activity Tolerance: Patient limited by fatigue Patient left: in bed;with call bell/phone within reach;with family/visitor present (2 daughters present)  OT Visit Diagnosis: Muscle weakness (generalized) (M62.81)                Time: [id number] OT Time Calculation (min): 21 min Charges:  OT General Charges $OT Visit: 1 Visit OT Evaluation $OT Eval Low Complexity: 1 Low  Harrel Carina, MS, OTR/L   Harrel Carina 07/05/2021, 3:57 PM

## 2021-07-06 LAB — CBC WITH DIFFERENTIAL/PLATELET
Abs Immature Granulocytes: 0.08 10*3/uL — ABNORMAL HIGH (ref 0.00–0.07)
Basophils Absolute: 0.1 10*3/uL (ref 0.0–0.1)
Basophils Relative: 1 %
Eosinophils Absolute: 0.1 10*3/uL (ref 0.0–0.5)
Eosinophils Relative: 1 %
HCT: 29.8 % — ABNORMAL LOW (ref 36.0–46.0)
Hemoglobin: 9.7 g/dL — ABNORMAL LOW (ref 12.0–15.0)
Immature Granulocytes: 1 %
Lymphocytes Relative: 4 %
Lymphs Abs: 0.4 10*3/uL — ABNORMAL LOW (ref 0.7–4.0)
MCH: 29.8 pg (ref 26.0–34.0)
MCHC: 32.6 g/dL (ref 30.0–36.0)
MCV: 91.4 fL (ref 80.0–100.0)
Monocytes Absolute: 0.6 10*3/uL (ref 0.1–1.0)
Monocytes Relative: 6 %
Neutro Abs: 8.9 10*3/uL — ABNORMAL HIGH (ref 1.7–7.7)
Neutrophils Relative %: 87 %
Platelets: 160 10*3/uL (ref 150–400)
RBC: 3.26 MIL/uL — ABNORMAL LOW (ref 3.87–5.11)
RDW: 14.1 % (ref 11.5–15.5)
Smear Review: NORMAL
WBC: 10.1 10*3/uL (ref 4.0–10.5)
nRBC: 0 % (ref 0.0–0.2)

## 2021-07-06 LAB — COMPREHENSIVE METABOLIC PANEL
ALT: 91 U/L — ABNORMAL HIGH (ref 0–44)
AST: 53 U/L — ABNORMAL HIGH (ref 15–41)
Albumin: 2.4 g/dL — ABNORMAL LOW (ref 3.5–5.0)
Alkaline Phosphatase: 151 U/L — ABNORMAL HIGH (ref 38–126)
Anion gap: 8 (ref 5–15)
BUN: 35 mg/dL — ABNORMAL HIGH (ref 8–23)
CO2: 25 mmol/L (ref 22–32)
Calcium: 7.7 mg/dL — ABNORMAL LOW (ref 8.9–10.3)
Chloride: 107 mmol/L (ref 98–111)
Creatinine, Ser: 1.61 mg/dL — ABNORMAL HIGH (ref 0.44–1.00)
GFR, Estimated: 30 mL/min — ABNORMAL LOW (ref >60)
Glucose, Bld: 107 mg/dL — ABNORMAL HIGH (ref 70–99)
Potassium: 3.7 mmol/L (ref 3.5–5.1)
Sodium: 140 mmol/L (ref 135–145)
Total Bilirubin: 0.9 mg/dL (ref 0.3–1.2)
Total Protein: 4.7 g/dL — ABNORMAL LOW (ref 6.5–8.1)

## 2021-07-06 LAB — MAGNESIUM: Magnesium: 1.2 mg/dL — ABNORMAL LOW (ref 1.7–2.4)

## 2021-07-06 LAB — LACTIC ACID, PLASMA: Lactic Acid, Venous: 1.3 mmol/L (ref 0.5–1.9)

## 2021-07-06 MED ORDER — HYDROMORPHONE HCL 1 MG/ML IJ SOLN: 0.5000 mg | INTRAMUSCULAR | Status: DC | PRN

## 2021-07-06 MED ORDER — PIPERACILLIN-TAZOBACTAM IN DEX 2-0.25 GM/50ML IV SOLN
2.2500 g | Freq: Three times a day (TID) | INTRAVENOUS | Status: DC
  Administered 2021-07-06 – 2021-07-07 (×3): 2.25 g via INTRAVENOUS
  Filled 2021-07-06 (×4): qty 50

## 2021-07-06 MED ORDER — OXYCODONE HCL 5 MG PO TABS
5.0000 mg | ORAL_TABLET | Freq: Four times a day (QID) | ORAL | Status: DC | PRN
Start: 2021-07-06 — End: 2021-07-07

## 2021-07-06 MED ORDER — ACETAMINOPHEN 325 MG PO TABS
650.0000 mg | ORAL_TABLET | Freq: Four times a day (QID) | ORAL | Status: DC | PRN
  Administered 2021-07-06: 18:00:00 650 mg via ORAL
  Filled 2021-07-06: qty 2

## 2021-07-06 MED ORDER — ORAL CARE MOUTH RINSE
15.0000 mL | Freq: Two times a day (BID) | OROMUCOSAL | Status: DC
  Administered 2021-07-07 – 2021-07-11 (×9): 15 mL via OROMUCOSAL

## 2021-07-06 MED ORDER — CHLORHEXIDINE GLUCONATE 0.12 % MT SOLN
15.0000 mL | Freq: Two times a day (BID) | OROMUCOSAL | Status: DC
  Administered 2021-07-06 – 2021-07-11 (×6): 15 mL via OROMUCOSAL
  Filled 2021-07-06 (×7): qty 15

## 2021-07-06 MED ORDER — MAGNESIUM SULFATE 2 GM/50ML IV SOLN
2.0000 g | Freq: Once | INTRAVENOUS | Status: AC
  Administered 2021-07-06: 2 g via INTRAVENOUS
  Filled 2021-07-06: qty 50

## 2021-07-06 NOTE — Consult Note (Signed)
Pharmacy Antibiotic Note  Robin Morales is a 87 y.o. female admitted on 07/04/2021 with acute on chronic cholecystitis. Initiatly started on ceftriaxone + metronidazole 7/11. Due to worsening fevers, pharmacy has been consulted for Zosyn dosing.  Plan: Zosyn decreased to 2.25 g IV q8h for CrCl < 20  Monitor clinical picture and renal function F/U C&S, abx deescalation / LOT   Height: '5\' 2"'$  (157.5 cm) Weight: 63.3 kg (139 lb 8 oz) IBW/kg (Calculated) : 50.1  Temp (24hrs), Avg:99 F (37.2 C), Min:98 F (36.7 C), Max:101.6 F (38.7 C)  Recent Labs  Lab 07/04/21 1346 07/04/21 1530 07/05/21 0439 07/06/21 0451  WBC 11.5*  --  16.9* 10.1  CREATININE 1.27*  --  1.39* 1.61*  LATICACIDVEN 2.5* 2.5*  --  1.3     Estimated Creatinine Clearance: 19.1 mL/min (A) (by C-G formula based on SCr of 1.61 mg/dL (H)).    Allergies  Allergen Reactions   Gabapentin     Other reaction(s): Dizziness   Keflex [Cephalexin] Itching    Antimicrobials this admission: 7/11 ceftriaxone >> 7/12 7/11 metronidazole >> 7/12 7/12 Zosyn >>  Dose adjustments this admission: N/a  Microbiology results: 7/11 BCx: NGTD 7/11 UCx: sent   Thank you for allowing pharmacy to be a part of this patient's care.  Darnelle Bos, PharmD Clinical Pharmacist   07/06/2021 12:07 PM

## 2021-07-06 NOTE — Progress Notes (Signed)
PT Cancellation Note  Patient Details Name: Robin Morales MRN: [id number] DOB: 09-09-1926   Cancelled Treatment:    Reason Eval/Treat Not Completed: Medical issues which prohibited therapy (Chart reviewed, evaluation deferred. Pt with tachycardia early in date, hypomagnesemia continues (1.2) elevating risk of cardiac arrythmia, also concerning in patients with advanced age. WIll defer evaluation to later date/time once appropriate.)  6:24 PM, 07/06/21 Etta Grandchild, PT, DPT Physical Therapist - [facility]  [phone] ([place])    [place] 07/06/2021, 6:24 PM

## 2021-07-06 NOTE — Progress Notes (Signed)
Occupational Therapy Treatment Patient Details Name: Robin Morales MRN: [id number] DOB: 06-27-1926 Today's Date: 07/06/2021    History of present illness Pt. is a 87 y.o. female who was admitted to [facility] with Acute Metabolic Encephalopathy secondary to Sepsis. PMHx includes: AFib, CAD, CVA, HTN, and HLD.   OT comments  Robin Morales was seen for OT treatment on this date. Upon arrival to room pt reclined in bed with family at bedside. MAX A sup<>sit and TOTAL A for lateral scoot at EOB. MIN A static sitting balance. MAX A hair brushing seated EOB. Pt making good progress toward goals. Pt continues to benefit from skilled OT services to maximize return to PLOF and minimize risk of future falls, injury, caregiver burden, and readmission. Will continue to follow POC. Discharge recommendation remains appropriate.    Follow Up Recommendations  SNF    Equipment Recommendations  None recommended by OT    Recommendations for Other Services      Precautions / Restrictions Precautions Precautions: Fall Restrictions Weight Bearing Restrictions: No       Mobility Bed Mobility Overal bed mobility: Needs Assistance Bed Mobility: Supine to Sit;Sit to Supine     Supine to sit: Max assist Sit to supine: Max assist        Transfers Overall transfer level: Needs assistance   Transfers: Lateral/Scoot Transfers          Lateral/Scoot Transfers: Total assist      Balance Overall balance assessment: Needs assistance Sitting-balance support: No upper extremity supported;Feet supported Sitting balance-Leahy Scale: Poor   Postural control: Right lateral lean                                 ADL either performed or assessed with clinical judgement   ADL Overall ADL's : Needs assistance/impaired                                       General ADL Comments: MIN A static sitting balance. MAX A hair brushign seated EOB.      Cognition Arousal/Alertness:  Awake/alert Behavior During Therapy: WFL for tasks assessed/performed Overall Cognitive Status: Within Functional Limits for tasks assessed                                          Exercises Exercises: Other exercises Other Exercises Other Exercises: Pt educated re; OT role, DME recs, d/c recs, falls prevention Other Exercises: sup<>sit, lateral scoot, grooming, sitting blanace/tolerance           Pertinent Vitals/ Pain       Pain Assessment: No/denies pain   Frequency  Min 1X/week        Progress Toward Goals  OT Goals(current goals can now be found in the care plan section)  Progress towards OT goals: Progressing toward goals  Acute Rehab OT Goals OT Goal Formulation: With patient/family Time For Goal Achievement: 07/19/21 ADL Goals Pt Will Perform Grooming: with min assist Additional ADL Goal #1: Pt./caregivers will demonstrate independence with UE ROM, and repositioning.  Plan Discharge plan remains appropriate;Frequency remains appropriate    Co-evaluation                 AM-PAC OT "6 Clicks" Daily Activity  Outcome Measure   Help from another person eating meals?: A Lot Help from another person taking care of personal grooming?: A Lot Help from another person toileting, which includes using toliet, bedpan, or urinal?: A Lot Help from another person bathing (including washing, rinsing, drying)?: A Lot Help from another person to put on and taking off regular upper body clothing?: A Lot Help from another person to put on and taking off regular lower body clothing?: A Lot 6 Click Score: 12    End of Session    OT Visit Diagnosis: Muscle weakness (generalized) (M62.81)   Activity Tolerance Patient limited by fatigue;Patient tolerated treatment well   Patient Left in bed;with call bell/phone within reach;with bed alarm set;with nursing/sitter in room;with family/visitor present   Nurse Communication          Time:  [id number] OT Time Calculation (min): 12 min  Charges: OT General Charges $OT Visit: 1 Visit OT Treatments $Self Care/Home Management : 8-22 mins  Robin Morales, M.S. OTR/L  07/06/21, 4:34 PM  ascom [phone]

## 2021-07-06 NOTE — Evaluation (Signed)
Clinical/Bedside Swallow Evaluation Patient Details  Name: Robin Morales MRN: [id number] Date of Birth: 1926-10-24  Today's Date: 07/06/2021 Time: SLP Start Time (ACUTE ONLY): 58 SLP Stop Time (ACUTE ONLY): 69 SLP Time Calculation (min) (ACUTE ONLY): 60 min  Past Medical History:  Past Medical History:  Diagnosis Date   A-fib ([place]) 03/28/2016   Arthritis    CAD (coronary artery disease) 07/10/2014   Chronic ischemic left PCA stroke 04/14/2016   Overview:  Left PCA   CVA (cerebral vascular accident) ([place])    Diabetes mellitus without complication ([place])    Gastroesophageal reflux disease without esophagitis 07/05/2015   Hypercholesterolemia    Hyperlipidemia 07/10/2014   Hypertension    MI (myocardial infarction) ([place])    TIA (transient ischemic attack) 03/28/2016   Type 2 diabetes mellitus with diabetic nephropathy ([place]) 01/14/2016   Past Surgical History:  Past Surgical History:  Procedure Laterality Date   HIP ARTHROPLASTY     HPI:  Pt is a 87 y.o. female with medical history significant of chronic A. fib, CAD, CVA, HTN, HLD, presented with new onset of abdominal pain and fever. Per MD note in ED, pt admitted w/ Sepsis; source is acute/recurrent cholecystitis. Conservative management of IV fluid, n.p.o. and IV antibiotics.  MD stated cleared pt for trials/BSE of Clear liquids for this BSE. Unsure of pt's Baseline Cognitive status - appears declined. Pt tends to lean to R side - Daughter endorsed this at home in her chair Baseline. Head CT: no acute abnormalities; chronic changes. CXR: Minimal right basilar atelectasis.   Assessment / Plan / Recommendation Clinical Impression  Pt appears to present w/ adequate oropharyngeal phase swallow function w/ No overt oropharyngeal phase dysphagia noted; No neuromuscular deficits noted. Pt consumed po trials (allowed d/t GI issues) w/ No overt, clinical s/s of aspiration during po trials. Pt appears at reduced risk for aspiration following  general aspiration precautions.   During po trials, pt consumed thin liquid and puree consistencies w/ no overt coughing or change in respiratory presentation during/post trials. Pt was nonverbal and did not immediately respond verbally despite requests from SLP, Dtr. Oral phase appeared [facility] w/ timely bolus management and control of bolus propulsion for A-P transfer for swallowing. Oral clearing achieved w/ trial consistencies. OM Exam was cursory but appeared [facility] w/ no unilateral weakness noted during bolus management. Pt fed self holding Cup w/ setup support; baseilne Cognitive decline suspected.   Recommend thin liquids in diet; per MD, pt OK for a Clear Liquids diet (thin consistency) d/t GI reasons at this time w/ MD/GI to continue to upgrade to soft foods in a primarily Regular consistency diet when able to per GI -- recommend well-Cut meats, moistened foods. General aspiration precautions, Pills Crushed vs Whole in a Puree for safer, easier swallowing as pt needs per NSG. Education given on Pills in Puree; food consistencies and easy to eat options when able to advance per GI; general aspiration precautions to support pt w/ po's(pt to help hold Cup when drinking) -- Daughter agreed. NSG to reconsult if any new needs arise while admitted. NSG agreed. SLP Visit Diagnosis: Dysphagia, unspecified (R13.10) (GI issues)    Aspiration Risk  Mild aspiration risk;Risk for inadequate nutrition/hydration (advanced age; Cognitive decline)    Diet Recommendation  Clear liquids diet (thins) w/ general aspiration and Reflux precautions and support at meals w/ feeding, setup  Medication Administration: Whole meds with puree (vs need to Crush in puree)    Other  Recommendations Recommended Consults:  (  Dietician f'/u; Palliative Care f/u) Oral Care Recommendations: Oral care BID;Oral care before and after PO;Patient independent with oral care Other Recommendations:  (NT)   Follow up Recommendations None       Frequency and Duration  (n/a)   (n/a)       Prognosis Prognosis for Safe Diet Advancement: Fair (-Good) Barriers to Reach Goals: Cognitive deficits;Time post onset;Severity of deficits Barriers/Prognosis Comment: suspect baseline Cognitive decilne; GERD      Swallow Study   General Date of Onset: 07/04/21 HPI: Pt is a 87 y.o. female with medical history significant of chronic A. fib, CAD, CVA, HTN, HLD, presented with new onset of abdominal pain and fever. Per MD note in ED, pt admitted w/ Sepsis; source is acute/recurrent cholecystitis. Conservative management of IV fluid, n.p.o. and IV antibiotics.  MD stated cleared pt for trials/BSE of Clear liquids for this BSE. Unsure of pt's Baseline Cognitive status - appears declined. Pt tends to lean to R side - Daughter endorsed this at home in her chair Baseline. Head CT: no acute abnormalities; chronic changes. CXR: Minimal right basilar atelectasis. Type of Study: Bedside Swallow Evaluation Previous Swallow Assessment: 2017 Diet Prior to this Study: NPO (regular diet at home) Temperature Spikes Noted: No (wbc 10.1) Respiratory Status: Nasal cannula (2L) History of Recent Intubation: No Behavior/Cognition: Alert;Cooperative;Pleasant mood;Confused;Distractible;Requires cueing (nonverbal) Oral Cavity Assessment:  (difficult to assess d/t pt's participation) Oral Care Completed by SLP:  (attempted) Oral Cavity - Dentition: Adequate natural dentition;Missing dentition (few) Vision: Functional for self-feeding Self-Feeding Abilities: Able to feed self;Needs assist;Needs set up;Total assist (guidance) Patient Positioning: Upright in bed (needed positioning) Baseline Vocal Quality:  (nonverbal) Volitional Cough: Cognitively unable to elicit Volitional Swallow: Unable to elicit    Oral/Motor/Sensory Function Overall Oral Motor/Sensory Function: Within functional limits (during bolus management)   Ice Chips Ice chips: Within functional  limits Presentation: Spoon (fed; 3 trials)   Thin Liquid Thin Liquid: Within functional limits Presentation: Self Fed;Straw (~12 ozs total) Other Comments: water, juice    Nectar Thick Nectar Thick Liquid: Not tested   Honey Thick Honey Thick Liquid: Not tested   Puree Puree: Within functional limits Presentation: Spoon (fed; 5 trials)   Solid     Solid: Not tested Other Comments: d/t GI reasons        Orinda Kenner, MS, [place] Speech Language Pathologist Rehab Services [phone] [facility] 07/06/2021,3:16 PM

## 2021-07-06 NOTE — Progress Notes (Signed)
   07/06/21 0540  Assess: MEWS Score  Temp 98.9 F (37.2 C)  BP (!) 149/99  Pulse Rate (!) 119  Resp 18  SpO2 97 %  O2 Device Nasal Cannula  Assess: MEWS Score  MEWS Temp 0  MEWS Systolic 0  MEWS Pulse 2  MEWS RR 0  MEWS LOC 0  MEWS Score 2  MEWS Score Color Yellow  Assess: if the MEWS score is Yellow or Red  Were vital signs taken at a resting state? Yes  Focused Assessment No change from prior assessment  Does the patient meet 2 or more of the SIRS criteria? No  MEWS guidelines implemented *See Row Information* No, previously red, continue vital signs every 4 hours  Notify: Charge Nurse/RN  Name of Charge Nurse/RN Notified Jackie, RN  Date Charge Nurse/RN Notified 07/12/21  Time Charge Nurse/RN Notified 0559  Assess: SIRS CRITERIA  SIRS Temperature  0  SIRS Pulse 1  SIRS Respirations  0  SIRS WBC 0  SIRS Score Sum  1

## 2021-07-06 NOTE — Plan of Care (Signed)

## 2021-07-06 NOTE — Progress Notes (Signed)
[facility] Progress Note  Patient: Robin Morales    JZP:[id number]  DOA: 07/04/2021     Date of Service: the patient was seen and examined on 07/06/2021  Brief hospital course: PMH of  chronic A. fib, CAD, CVA, HTN, HLD.  Presents with abdominal pain found to have acute on chronic cholecystitis. Currently plan is IV antibiotics and monitor for improvement, continue to engage with the family regarding goals of care.  Subjective: Alert.  Denies any acute complaint.  No nausea no vomiting.  No fever no chills.  No abdominal pain.  Passing gas.  Assessment and Plan: 1.  Sepsis, present on admission secondary to acute on chronic cholecystitis Met SIRS criteria on admission with tachycardia, fever Currently conservative measures for cholecystitis. Family refuses surgical intervention and patient will not be a good candidate for any intervention regardless. Initially on IV ceftriaxone.  Due to persistent fever patient was switched to IV Zosyn.  With improvement in leukocytosis as well as LFT now. Monitor clinical response. Lactic acid level improving.  2.  Acute metabolic encephalopathy Secondary to sepsis.  Appears to be clearing close to baseline Currently avoiding psychotropic medication. CT head unremarkable for any acute stroke. Monitor  3.  Chronic A. Fib with RVR Currently rate controlled. Patient is on metoprolol will use as needed IV metoprolol. Will add scheduled lopressor with holding parameters.  On Eliquis which we will continue for now.  4.  HTN Blood pressure soft. Monitor.  5.  Dysphagia Likely secondary to lethargy from encephalopathy in the setting of sepsis. Also has dementia. Will get speech evaluation. Okay to advance diet to clear liquid diet today as long as she passes a swallow eval.  6.  GERD Continue PPI.  7. Goal of care D/w two daughters  Pt appears to be doing poorly with regard to the infection.  Daughter wants to continue current care.    8.  Chronic kidney disease stage IV. Baseline serum creatinine around 2.0. On admission serum creatinine was 1.27. Currently worsening to 1.61. Unsure whether this will be an acute kidney injury or not with the patient does have a significant 0.3 Mg per DL increase in creatinine. Monitor renal output.  10.  Elevated LFT. Improving with hydration and treatment. Monitor.  Scheduled Meds:  apixaban  2.5 mg Oral BID   mouth rinse  15 mL Mouth Rinse BID   metoprolol tartrate  2.5 mg Intravenous Q6H   pantoprazole (PROTONIX) IV  40 mg Intravenous Q24H   Continuous Infusions:  lactated ringers 50 mL/hr at 07/05/21 1513   piperacillin-tazobactam (ZOSYN)  IV 3.375 g (07/06/21 0522)   PRN Meds: acetaminophen, HYDROmorphone (DILAUDID) injection, metoprolol tartrate, ondansetron **OR** ondansetron (ZOFRAN) IV, oxyCODONE, senna-docusate  Body mass index is 25.51 kg/m.     DVT Prophylaxis:   apixaban (ELIQUIS) tablet 2.5 mg Start: 07/04/21 2200 apixaban (ELIQUIS) tablet 2.5 mg    Advance goals of care discussion: Pt is DNR.  Family Communication: family was present at bedside, at the time of interview.  The pt provided permission to discuss medical plan with the family. Opportunity was given to ask question and all questions were answered satisfactorily.   Data Reviewed: I have personally reviewed and interpreted daily labs, tele strips, imaging. Serum creatinine from 1.27-1.39-1.61. AST 310-53. Total bilirubin 1.7-0.9.  Physical Exam:  General: Appear in mild distress, no Rash; Oral Mucosa Clear, moist. no Abnormal Neck Mass Or lumps, Conjunctiva normal  Cardiovascular: S1 and S2 Present, no Murmur, Respiratory: good respiratory  effort, Bilateral Air entry present and CTA, no Crackles, no wheezes Abdomen: Bowel Sound present, Soft and no tenderness Extremities: Trace pedal edema Neurology: Tired, but alert and oriented to person affect appropriate. no new focal deficit Gait  not checked due to patient safety concerns  Vitals:   07/05/21 2205 07/06/21 0130 07/06/21 0540 07/06/21 0944  BP: (!) 92/56 (!) 90/56 (!) 149/99 129/76  Pulse: 95 95 (!) 119 92  Resp:  _0 Temp: 98 F (36.7 C) 98.2 F (36.8 C) 98.9 F (37.2 C) 100 F (37.8 C)  TempSrc: Oral Oral    SpO2: 97% 98% 97% 97%  Weight:      Height:        Disposition:  Status is: Inpatient  Remains inpatient appropriate because:IV treatments appropriate due to intensity of illness or inability to take PO and Inpatient level of care appropriate due to severity of illness  Dispo: The patient is from: SNF              Anticipated d/c is to: SNF              Patient currently is not medically stable to d/c.   Difficult to place patient No        Time spent: 35 minutes. I reviewed all nursing notes, pharmacy notes, vitals, pertinent old records. I have discussed plan of care as described above with RN.  Author: Berle Mull, MD [facility] 07/06/2021 11:20 AM  To reach On-call, see care teams to locate the attending and reach out via [email or web address]. Between 7PM-7AM, please contact night-coverage If you still have difficulty reaching the attending provider, please page the [facility] (Director on Call) for [facility] on amion for assistance.

## 2021-07-06 NOTE — Progress Notes (Signed)
Assumed care of patient.

## 2021-07-07 LAB — CBC WITH DIFFERENTIAL/PLATELET
Abs Immature Granulocytes: 0.18 10*3/uL — ABNORMAL HIGH (ref 0.00–0.07)
Basophils Absolute: 0.1 10*3/uL (ref 0.0–0.1)
Basophils Relative: 1 %
Eosinophils Absolute: 0.2 10*3/uL (ref 0.0–0.5)
Eosinophils Relative: 2 %
HCT: 37 % (ref 36.0–46.0)
Hemoglobin: 11.4 g/dL — ABNORMAL LOW (ref 12.0–15.0)
Immature Granulocytes: 2 %
Lymphocytes Relative: 6 %
Lymphs Abs: 0.6 10*3/uL — ABNORMAL LOW (ref 0.7–4.0)
MCH: 28.1 pg (ref 26.0–34.0)
MCHC: 30.8 g/dL (ref 30.0–36.0)
MCV: 91.1 fL (ref 80.0–100.0)
Monocytes Absolute: 0.6 10*3/uL (ref 0.1–1.0)
Monocytes Relative: 6 %
Neutro Abs: 9.4 10*3/uL — ABNORMAL HIGH (ref 1.7–7.7)
Neutrophils Relative %: 83 %
Platelets: 163 10*3/uL (ref 150–400)
RBC: 4.06 MIL/uL (ref 3.87–5.11)
RDW: 14.1 % (ref 11.5–15.5)
Smear Review: NORMAL
WBC Morphology: INCREASED
WBC: 11.1 10*3/uL — ABNORMAL HIGH (ref 4.0–10.5)
nRBC: 0 % (ref 0.0–0.2)

## 2021-07-07 LAB — COMPREHENSIVE METABOLIC PANEL
ALT: 76 U/L — ABNORMAL HIGH (ref 0–44)
AST: 37 U/L (ref 15–41)
Albumin: 2.7 g/dL — ABNORMAL LOW (ref 3.5–5.0)
Alkaline Phosphatase: 165 U/L — ABNORMAL HIGH (ref 38–126)
Anion gap: 7 (ref 5–15)
BUN: 28 mg/dL — ABNORMAL HIGH (ref 8–23)
CO2: 25 mmol/L (ref 22–32)
Calcium: 8.3 mg/dL — ABNORMAL LOW (ref 8.9–10.3)
Chloride: 108 mmol/L (ref 98–111)
Creatinine, Ser: 1.45 mg/dL — ABNORMAL HIGH (ref 0.44–1.00)
GFR, Estimated: 34 mL/min — ABNORMAL LOW (ref >60)
Glucose, Bld: 104 mg/dL — ABNORMAL HIGH (ref 70–99)
Potassium: 3.6 mmol/L (ref 3.5–5.1)
Sodium: 140 mmol/L (ref 135–145)
Total Bilirubin: 0.8 mg/dL (ref 0.3–1.2)
Total Protein: 5.3 g/dL — ABNORMAL LOW (ref 6.5–8.1)

## 2021-07-07 MED ORDER — GLYCOPYRROLATE 0.2 MG/ML IJ SOLN
0.2000 mg | INTRAMUSCULAR | Status: DC | PRN
  Filled 2021-07-07: qty 1

## 2021-07-07 MED ORDER — HALOPERIDOL LACTATE 2 MG/ML PO CONC
0.5000 mg | ORAL | Status: DC | PRN
  Filled 2021-07-07: qty 0.3

## 2021-07-07 MED ORDER — ACETAMINOPHEN 650 MG RE SUPP
650.0000 mg | Freq: Four times a day (QID) | RECTAL | Status: DC | PRN
  Administered 2021-07-08: 11:00:00 650 mg via RECTAL
  Filled 2021-07-07: qty 1

## 2021-07-07 MED ORDER — LORAZEPAM 2 MG/ML PO CONC: 1.0000 mg | ORAL | Status: DC | PRN

## 2021-07-07 MED ORDER — DIPHENHYDRAMINE HCL 50 MG/ML IJ SOLN: 12.5000 mg | INTRAMUSCULAR | Status: DC | PRN

## 2021-07-07 MED ORDER — HYDROMORPHONE HCL 1 MG/ML IJ SOLN
0.5000 mg | INTRAMUSCULAR | Status: DC | PRN
  Administered 2021-07-07 – 2021-07-08 (×2): 0.5 mg via INTRAVENOUS
  Filled 2021-07-07 (×2): qty 1

## 2021-07-07 MED ORDER — GLYCOPYRROLATE 0.2 MG/ML IJ SOLN
0.2000 mg | INTRAMUSCULAR | Status: DC | PRN
  Administered 2021-07-07 – 2021-07-11 (×3): 0.2 mg via INTRAVENOUS
  Filled 2021-07-07 (×2): qty 1

## 2021-07-07 MED ORDER — HALOPERIDOL 0.5 MG PO TABS
0.5000 mg | ORAL_TABLET | ORAL | Status: DC | PRN
  Filled 2021-07-07: qty 1

## 2021-07-07 MED ORDER — METOPROLOL TARTRATE 25 MG PO TABS
25.0000 mg | ORAL_TABLET | Freq: Two times a day (BID) | ORAL | Status: DC
  Administered 2021-07-09 – 2021-07-11 (×5): 25 mg via ORAL
  Filled 2021-07-07 (×6): qty 1

## 2021-07-07 MED ORDER — OXYCODONE HCL 20 MG/ML PO CONC: 5.0000 mg | ORAL | Status: DC | PRN

## 2021-07-07 MED ORDER — GLYCOPYRROLATE 1 MG PO TABS
1.0000 mg | ORAL_TABLET | ORAL | Status: DC | PRN
  Filled 2021-07-07: qty 1

## 2021-07-07 MED ORDER — LORAZEPAM 1 MG PO TABS: 1.0000 mg | ORAL_TABLET | ORAL | Status: DC | PRN

## 2021-07-07 MED ORDER — ONDANSETRON 4 MG PO TBDP
4.0000 mg | ORAL_TABLET | Freq: Four times a day (QID) | ORAL | Status: DC | PRN
Start: 2021-07-07 — End: 2021-07-11
  Filled 2021-07-07: qty 1

## 2021-07-07 MED ORDER — PIPERACILLIN-TAZOBACTAM 3.375 G IVPB: 3.3750 g | Freq: Three times a day (TID) | INTRAVENOUS | Status: DC

## 2021-07-07 MED ORDER — ONDANSETRON HCL 4 MG/2ML IJ SOLN: 4.0000 mg | Freq: Four times a day (QID) | INTRAMUSCULAR | Status: DC | PRN

## 2021-07-07 MED ORDER — HALOPERIDOL LACTATE 5 MG/ML IJ SOLN: 0.5000 mg | INTRAMUSCULAR | Status: DC | PRN

## 2021-07-07 MED ORDER — LORAZEPAM 2 MG/ML IJ SOLN: 1.0000 mg | INTRAMUSCULAR | Status: DC | PRN

## 2021-07-07 MED ORDER — ACETAMINOPHEN 325 MG PO TABS: 650.0000 mg | ORAL_TABLET | Freq: Four times a day (QID) | ORAL | Status: DC | PRN

## 2021-07-07 NOTE — Progress Notes (Signed)
   07/07/21 1200  Clinical Encounter Type  Visited With Patient and family together  Visit Type Initial  Referral From Nurse  Consult/Referral To [place] responded to an OR for room 125A Pt, Ms. Robin Morales. Two family members by pt bedside. Family member declined prayer or visit from [place]. Stated, they do not need anything. Chaplain Alper Guilmette relayed message to nurses station.

## 2021-07-07 NOTE — Progress Notes (Signed)
[facility] Progress Note  Patient: Robin Morales    WUJ:[id number]  DOA: 07/04/2021     Date of Service: the patient was seen and examined on 07/07/2021  Brief hospital course: PMH of  chronic A. fib, CAD, CVA, HTN, HLD.  Presents with abdominal pain found to have acute on chronic cholecystitis. Clinical condition continues to worsen patient remains febrile despite IV Zosyn. Currently plan is comfort care.  Subjective: Remains fatigued and tired.  Nonverbal.  Unable to follow any commands.  Had a breakfast before I arrived.  Assessment and Plan: 1.  Sepsis, present on admission secondary to acute on chronic cholecystitis Met SIRS criteria on admission with tachycardia, fever Currently conservative measures for cholecystitis. Family refuses surgical intervention and patient will not be a good candidate for any intervention regardless. Initially on IV ceftriaxone.  Due to persistent fever patient was switched to IV Zosyn.  With improvement in leukocytosis as well as LFT now. Clinically patient appears to be worsening with continuous fever. Family decided transition to comfort care.  2.  Acute metabolic encephalopathy Secondary to sepsis. Mentation worsening again on 7/14.  Patient unable to follow any commands.  Nonverbal.  Not withdrawing to painful stimuli. Now comfort care. Further work-up including stroke work-up would not change management and the patient is already on anticoagulation.  3.  Chronic A. Fib with RVR Currently rate controlled. Patient was treated with IV metoprolol on a scheduled basis.  Now comfort care. On Eliquis.  Now comfort care.  4.  HTN Blood pressure soft. Monitor.  5.  Dysphagia Likely secondary to lethargy from encephalopathy in the setting of sepsis. Also has dementia. Speech therapy was consulted. Patient was able to tolerate liquid diet. Currently comfort care.  Comfort feed.  6.  GERD Was on PPI.  7. Goal of care D/w two  daughters  Presented with acute kidney injury on chronic kidney disease secondary to cholecystitis.  Appears to be doing poorly with persistent fever and tachycardia. Mentation worsening now with nonverbal and unable to follow any commands. Discussed with family.  Currently agreeable to transition to complete comfort.  Will monitor.  Referred to residential hospice.  8.  Chronic kidney disease stage IV. Baseline serum creatinine around 2.0. On admission serum creatinine was 1.27. Currently worsening to 1.61. Unsure whether this will be an acute kidney injury or not with the patient does have a significant 0.3 Mg per DL increase in creatinine. Monitor renal output.  10.  Elevated LFT. Improving with hydration and treatment. Monitor.  Scheduled Meds:  chlorhexidine  15 mL Mouth Rinse BID   mouth rinse  15 mL Mouth Rinse BID   mouth rinse  15 mL Mouth Rinse q12n4p   metoprolol tartrate  25 mg Oral BID   Continuous Infusions:   PRN Meds: acetaminophen **OR** acetaminophen, diphenhydrAMINE, glycopyrrolate **OR** glycopyrrolate **OR** glycopyrrolate, haloperidol **OR** haloperidol **OR** haloperidol lactate, HYDROmorphone (DILAUDID) injection, LORazepam **OR** LORazepam **OR** LORazepam, LORazepam, ondansetron **OR** ondansetron (ZOFRAN) IV, oxyCODONE **OR** oxyCODONE  Body mass index is 25.51 kg/m.     DVT Prophylaxis:       Advance goals of care discussion: Pt is DNR.  Comfort care.  Family Communication: family was present at bedside, at the time of interview.   Data Reviewed: I have personally reviewed and interpreted daily labs, tele strips, imaging. Serum creatinine improved.  LFTs improved.  White count improved.  Physical Exam:  General: Appear in mild distress, no Rash; Oral Mucosa Clear, moist. no Abnormal Neck Mass  Or lumps, Conjunctiva normal  Cardiovascular: S1 and S2 Present, no Murmur, Respiratory: increased respiratory effort, Bilateral Air entry present and  bilateral  Crackles, no wheezes Abdomen: Bowel Sound present, Soft and no tenderness Extremities: Bilateral upper and pedal edema Neurology: Alert, unable to follow any commands, nonverbal.  Not withdrawing to painful stimuli.  Vitals:   07/07/21 0202 07/07/21 0515 07/07/21 0735 07/07/21 1136  BP: (!) 146/71 (!) 160/67 (!) 142/87 132/74  Pulse: 90 96 85 73  Resp: $Remo'20 20 18 16  'VfXsz$ Temp: 98.5 F (36.9 C) 99.3 F (37.4 C) 98.7 F (37.1 C) 98.3 F (36.8 C)  TempSrc:   Oral Oral  SpO2: 98% 98% 96% 99%  Weight:      Height:        Disposition:  Status is: Inpatient  Remains inpatient appropriate because:IV treatments appropriate due to intensity of illness or inability to take PO and Inpatient level of care appropriate due to severity of illness  Dispo: The patient is from: SNF              Anticipated d/c is to: SNF              Patient currently is not medically stable to d/c.   Difficult to place patient No        Time spent: 35 minutes. I reviewed all nursing notes, pharmacy notes, vitals, pertinent old records. I have discussed plan of care as described above with RN.  Author: Berle Mull, MD [facility] 07/07/2021 6:55 PM  To reach On-call, see care teams to locate the attending and reach out via [email or web address]. Between 7PM-7AM, please contact night-coverage If you still have difficulty reaching the attending provider, please page the [facility] (Director on Call) for [facility] on amion for assistance.

## 2021-07-07 NOTE — Consult Note (Signed)
Pharmacy Antibiotic Note  Robin Morales is a 87 y.o. female admitted on 07/04/2021 with acute on chronic cholecystitis. Initiatly started on ceftriaxone + metronidazole 7/11. Due to worsening fevers, pharmacy has been consulted for Zosyn dosing.  Plan: Zosyn increased to 3.375 g IV q8h (4h infusion) for CrCl > 20  Monitor clinical picture and renal function F/U C&S, abx deescalation / LOT   Height: '5\' 2"'$  (157.5 cm) Weight: 63.3 kg (139 lb 8 oz) IBW/kg (Calculated) : 50.1  Temp (24hrs), Avg:99.7 F (37.6 C), Min:98.5 F (36.9 C), Max:101 F (38.3 C)  Recent Labs  Lab 07/04/21 1346 07/04/21 1530 07/05/21 0439 07/06/21 0451 07/07/21 0433  WBC 11.5*  --  16.9* 10.1 11.1*  CREATININE 1.27*  --  1.39* 1.61* 1.45*  LATICACIDVEN 2.5* 2.5*  --  1.3  --      Estimated Creatinine Clearance: 21.2 mL/min (A) (by C-G formula based on SCr of 1.45 mg/dL (H)).    Allergies  Allergen Reactions   Gabapentin     Other reaction(s): Dizziness   Keflex [Cephalexin] Itching    Antimicrobials this admission: 7/11 ceftriaxone >> 7/12 7/11 metronidazole >> 7/12 7/12 Zosyn >>  Dose adjustments this admission: N/a  Microbiology results: 7/11 BCx: NGTD  Thank you for allowing pharmacy to be a part of this patient's care.  Darnelle Bos, PharmD Clinical Pharmacist   07/07/2021 9:30 AM

## 2021-07-07 NOTE — TOC Progression Note (Signed)
Transition of Care [facility]) - Progression Note    Patient Details  Name: Robin Morales MRN: [id number] Date of Birth: [birth year]  Transition of Care [facility]) CM/SW [place], RN Phone Number: 07/07/2021, 3:41 PM  Clinical Narrative:   Palliative consult, patient made comfort care, family at bedside.     Expected Discharge Plan: [place] Barriers to Discharge: Continued Medical Work up  Expected Discharge Plan and Services Expected Discharge Plan: [place]   Discharge Planning Services: CM Consult Post Acute Care Choice: [place] arrangements for the past 2 months: Single Family Home                                       Social Determinants of Health (SDOH) Interventions    Readmission Risk Interventions Readmission Risk Prevention Plan 12/08/2018  Transportation Screening Complete  PCP or Specialist Appt within 5-7 Days Complete  Home Care Screening Complete  Medication Review (RN CM) Complete  Some recent data might be hidden

## 2021-07-07 NOTE — Progress Notes (Signed)
PT Cancellation Note  Patient Details Name: Robin Morales MRN: [id number] DOB: [birth year]   Cancelled Treatment:    Reason Eval/Treat Not Completed: Medical issues which prohibited therapy (Pt has no updated Magnesium lab for today. Last available is previous day @ 1.'2mg'$ /dL. Given association of hypomagnesemia and cardiac arrythmia, will defer PT evaluation until Mg is within safe range.)  11:01 AM, 07/07/21 Etta Grandchild, PT, DPT Physical Therapist - [facility]  [phone] ([place])    [place] C 07/07/2021, 11:00 AM

## 2021-07-07 NOTE — Progress Notes (Signed)
OT Cancellation Note  Patient Details Name: RASHITA KUHLMAN MRN: [id number] DOB: 1926-03-23   Cancelled Treatment:    Reason Eval/Treat Not Completed: Other (comment)MD reports pt is going comfort care. OT order completed at this time.   Darleen Crocker, MS, OTR/L , CBIS ascom [phone]  07/07/21, 11:47 AM

## 2021-07-08 MED ORDER — KETOROLAC TROMETHAMINE 30 MG/ML IJ SOLN
30.0000 mg | Freq: Once | INTRAMUSCULAR | Status: AC
  Administered 2021-07-08: 30 mg via INTRAVENOUS
  Filled 2021-07-08: qty 1

## 2021-07-08 NOTE — Care Management Important Message (Signed)
Important Message  Patient Details  Name: Robin Morales MRN: [id number] Date of Birth: 09-09-1926   Medicare Important Message Given:  Other (see comment)  Patient is comfort care and out of respect for the patient and family no Important Message from Medicare given.   [name] [name] 07/08/2021, 9:20 AM

## 2021-07-08 NOTE — Progress Notes (Signed)
[facility] Progress Note  Patient: Robin Morales    [id number]:[id number]  DOA: 07/04/2021     Date of Service: the patient was seen and examined on 07/08/2021  Brief hospital course: PMH of  chronic A. fib, CAD, CVA, HTN, HLD.  Presents with abdominal pain found to have acute on chronic cholecystitis. Clinical condition continues to worsen patient remains febrile despite IV Zosyn. Currently plan is comfort care.  Subjective: Remains fatigued.  No nausea no vomiting.  Appears comfortable.  Was able to eat something this morning.  Assessment and Plan: 1.  Sepsis, present on admission secondary to acute on chronic cholecystitis Met SIRS criteria on admission with tachycardia, fever Currently conservative measures for cholecystitis. Family refuses surgical intervention and patient will not be a good candidate for any intervention regardless. Initially on IV ceftriaxone.   Due to persistent fever patient was switched to IV Zosyn.   With improvement in leukocytosis as well as LFT now. Clinically patient appears to be worsening with continuous fever. Family decided transition to comfort care.  2.  Acute metabolic encephalopathy Secondary to sepsis. Mentation worsening again on 7/14.  Patient unable to follow any commands.  Nonverbal.  Not withdrawing to painful stimuli. Now comfort care. Further work-up including stroke work-up would not change management and the patient is already on anticoagulation.  3.  Chronic A. Fib with RVR Currently rate controlled. Patient was treated with IV metoprolol on a scheduled basis.  Now comfort care. On Eliquis.  Now comfort care.  4.  HTN Blood pressure soft. Monitor.  5.  Dysphagia Likely secondary to lethargy from encephalopathy in the setting of sepsis. Also has dementia. Speech therapy was consulted. Patient was able to tolerate liquid diet. Currently comfort care.  Comfort feed.  6.  GERD Was on PPI.  7. Goal of care D/w two  daughters  Presented with acute kidney injury on chronic kidney disease secondary to cholecystitis.  Appears to be doing poorly with persistent fever and tachycardia. Mentation worsening now with nonverbal and unable to follow any commands. Discussed with family.  Currently agreeable to transition to complete comfort.  Will monitor.  Referred to residential hospice.  8.  Chronic kidney disease stage IV. Baseline serum creatinine around 2.0. On admission serum creatinine was 1.27. Currently worsening to 1.61. Unsure whether this will be an acute kidney injury or not with the patient does have a significant 0.3 Mg per DL increase in creatinine. Monitor renal output.  10.  Elevated LFT. Improving with hydration and treatment. Monitor.  Scheduled Meds:  chlorhexidine  15 mL Mouth Rinse BID   mouth rinse  15 mL Mouth Rinse BID   mouth rinse  15 mL Mouth Rinse q12n4p   metoprolol tartrate  25 mg Oral BID   Continuous Infusions:   PRN Meds: acetaminophen **OR** acetaminophen, diphenhydrAMINE, glycopyrrolate **OR** glycopyrrolate **OR** glycopyrrolate, haloperidol **OR** haloperidol **OR** haloperidol lactate, HYDROmorphone (DILAUDID) injection, LORazepam **OR** LORazepam **OR** LORazepam, LORazepam, ondansetron **OR** ondansetron (ZOFRAN) IV, oxyCODONE **OR** oxyCODONE  Body mass index is 25.51 kg/m.     DVT Prophylaxis:       Advance goals of care discussion: Pt is DNR.  Comfort care.  Family Communication: family was present at bedside, at the time of interview.   Physical Exam:  General: Appear in mild distress, no Rash;  Cardiovascular: S1 and S2 Present, no Murmur, Respiratory: good respiratory effort, Bilateral Air entry present and CTA, no Crackles, no wheezes Abdomen: Bowel Sound absent  Extremities: no Pedal edema  Neurology: Lethargic.  Vitals:   07/07/21 0515 07/07/21 0735 07/07/21 1136 07/07/21 2245  BP: (!) 160/67 (!) 142/87 132/74 (!) 121/59  Pulse: 96 85 73  (!) 110  Resp: $Remo'20 18 16 18  'woHLC$ Temp: 99.3 F (37.4 C) 98.7 F (37.1 C) 98.3 F (36.8 C) 100 F (37.8 C)  TempSrc:  Oral Oral Oral  SpO2: 98% 96% 99% 96%  Weight:      Height:        Disposition:  Status is: Inpatient  Remains inpatient appropriate because:IV treatments appropriate due to intensity of illness or inability to take PO and Inpatient level of care appropriate due to severity of illness  Dispo: The patient is from: SNF              Anticipated d/c is to: SNF              Patient currently is not medically stable to d/c.   Difficult to place patient No        Time spent: 35 minutes. I reviewed all nursing notes, pharmacy notes, vitals, pertinent old records. I have discussed plan of care as described above with RN.  Author: Berle Mull, MD [facility] 07/08/2021 6:08 PM  To reach On-call, see care teams to locate the attending and reach out via [email or web address]. Between 7PM-7AM, please contact night-coverage If you still have difficulty reaching the attending provider, please page the [facility] (Director on Call) for [facility] on amion for assistance.

## 2021-07-08 NOTE — TOC Progression Note (Signed)
Transition of Care [facility]) - Progression Note    Patient Details  Name: Robin Morales MRN: [id number] Date of Birth: 22-Jan-1926  Transition of Care [facility]) CM/SW [place], RN Phone Number: 07/08/2021, 4:01 PM  Clinical Narrative:   RNCM in to see patient's family, daughters and granddaughter present at bedside.  Family states they wish for patient to be seen by [facility] and transfer to [facility] in [place].  Sonia Baller from [facility] notified, no bed today.    Expected Discharge Plan: [place] Barriers to Discharge: Continued Medical Work up  Expected Discharge Plan and Services Expected Discharge Plan: [place]   Discharge Planning Services: CM Consult Post Acute Care Choice: [place] arrangements for the past 2 months: Single Family Home                                       Social Determinants of Health (SDOH) Interventions    Readmission Risk Interventions Readmission Risk Prevention Plan 07/08/2021 12/08/2018  Transportation Screening Complete Complete  PCP or Specialist Appt within 5-7 Days Complete Complete  Home Care Screening Complete Complete  Medication Review (RN CM) Complete Complete  Some recent data might be hidden

## 2021-07-08 NOTE — Progress Notes (Signed)
[place] [facility]) Hospital Liaison RN note  Received request from Dreama Saa, RN Transitions of Care Manager for family interest in [place]. Met with patient and family to confirm interest and explain services.   Chart and patient information reviewed by [facility] physician. Hospice Home eligibility confirmed.   Unfortunately Hospice Home is unable to offer a room today.   Family and Dreama Saa, RN [facility] Manager aware hospital liaison will follow up tomorrow or sooner if a room becomes available.   Please do not hesitate to call with any hospice related questions or concerns.   Thank you,   Bobbie "Loren Racer, [place], BSN [facility] Liaison [phone]

## 2021-07-09 LAB — CULTURE, BLOOD (SINGLE): Culture: NO GROWTH

## 2021-07-09 MED ORDER — RISPERIDONE 0.25 MG PO TABS
0.2500 mg | ORAL_TABLET | Freq: Two times a day (BID) | ORAL | Status: DC
  Administered 2021-07-09 – 2021-07-11 (×4): 0.25 mg via ORAL
  Filled 2021-07-09 (×5): qty 1

## 2021-07-09 NOTE — Progress Notes (Signed)
[facility] Progress Note  Patient: Robin Morales    VZS:[id number]  DOA: 07/04/2021     Date of Service: the patient was seen and examined on 07/09/2021  Brief hospital course: PMH of  chronic A. fib, CAD, CVA, HTN, HLD.  Presents with abdominal pain found to have acute on chronic cholecystitis. Clinical condition continues to worsen patient remains febrile despite IV Zosyn. Currently plan is comfort care.  Subjective: More alert.  Drank boost and had some bites of breakfast.  No nausea or vomiting.  No pain.  Breathing improved.  Assessment and Plan: 1.  Sepsis, present on admission secondary to acute on chronic cholecystitis Met SIRS criteria on admission with tachycardia, fever Currently conservative measures for cholecystitis. Family refuses surgical intervention and patient will not be a good candidate for any intervention regardless. Initially on IV ceftriaxone.   Due to persistent fever patient was switched to IV Zosyn.   With improvement in leukocytosis as well as LFT now. Clinically patient appears to be worsening with continuous fever. Family decided transition to comfort care.  2.  Acute metabolic encephalopathy Secondary to sepsis. Mentation worsening again on 7/14.  Patient unable to follow any commands.  Nonverbal.  Not withdrawing to painful stimuli. Now comfort care.  3.  Chronic A. Fib with RVR Currently rate controlled. Patient was treated with IV metoprolol on a scheduled basis.  Now comfort care. On Eliquis.  Now comfort care.  4.  HTN Blood pressure soft. Monitor.  5.  Dysphagia Likely secondary to lethargy from encephalopathy in the setting of sepsis. Also has dementia. Speech therapy was consulted. Patient was able to tolerate liquid diet. Currently comfort care.  Comfort feed.  6.  GERD Was on PPI.  7. Goal of care D/w two daughters  Presented with acute kidney injury on chronic kidney disease secondary to cholecystitis.  Appears to  be doing poorly with persistent fever and tachycardia. Mentation worsening now with nonverbal and unable to follow any commands. Discussed with family.  Currently agreeable to transition to complete comfort.  Will monitor.  Referred to residential hospice. 7/16 extensive discussion with the rest of the family member.  They had multiple questions regarding goals of care as well as patient's current prognosis and status. Informed that the patient remains at risk for poor prognosis and a good candidate for hospice. If the patient continues to improve may not be a candidate for residential hospice. Family currently agrees to continue comfort care.  Monitor.  8.  Chronic kidney disease stage IV. Acute urinary retention Baseline serum creatinine around 2.0. On admission serum creatinine was 1.27. Currently worsening to 1.61. Unsure whether this will be an acute kidney injury or not with the patient does have a significant 0.3 Mg per DL increase in creatinine. Monitor renal output. Foley catheter.  10.  Elevated LFT. Improving with hydration and treatment. Monitor.  Scheduled Meds:  chlorhexidine  15 mL Mouth Rinse BID   mouth rinse  15 mL Mouth Rinse BID   mouth rinse  15 mL Mouth Rinse q12n4p   metoprolol tartrate  25 mg Oral BID   risperiDONE  0.25 mg Oral BID   Continuous Infusions:   PRN Meds: acetaminophen **OR** acetaminophen, diphenhydrAMINE, glycopyrrolate **OR** glycopyrrolate **OR** glycopyrrolate, haloperidol **OR** haloperidol **OR** haloperidol lactate, HYDROmorphone (DILAUDID) injection, LORazepam **OR** LORazepam **OR** LORazepam, LORazepam, ondansetron **OR** ondansetron (ZOFRAN) IV, oxyCODONE **OR** oxyCODONE  Body mass index is 25.51 kg/m.     DVT Prophylaxis:  Advance goals of care discussion: Pt is DNR.  Comfort care.  Family Communication: family was present at bedside, at the time of interview.   Physical Exam:  General: Appear in mild distress, no  Rash; Oral Mucosa Clear, moist. no Abnormal Neck Mass Or lumps, Conjunctiva normal  Cardiovascular: S1 and S2 Present, no Murmur, Respiratory: good respiratory effort, Bilateral Air entry present and CTA, no Crackles, no wheezes Abdomen: Bowel Sound present Extremities: no Pedal edema Neurology: alert and able to follow command. affect appropriate. no new focal deficit Gait not checked due to patient safety concerns   Vitals:   07/07/21 1136 07/07/21 2245 07/09/21 0000 07/09/21 0918  BP: 132/74 (!) 121/59  (!) 151/59  Pulse: 73 (!) 110  96  Resp: _0 Temp: 98.3 F (36.8 C) 100 F (37.8 C) 98.7 F (37.1 C) 97.8 F (36.6 C)  TempSrc: Oral Oral Axillary   SpO2: 99% 96%  95%  Weight:      Height:        Disposition:  Status is: Inpatient  Remains inpatient appropriate because:IV treatments appropriate due to intensity of illness or inability to take PO and Inpatient level of care appropriate due to severity of illness  Dispo: The patient is from: SNF              Anticipated d/c is to: SNF              Patient currently is not medically stable to d/c.   Difficult to place patient No  Time spent: 35 minutes. I reviewed all nursing notes, pharmacy notes, vitals, pertinent old records. I have discussed plan of care as described above with RN.  Author: Berle Mull, MD [facility] 07/09/2021 7:00 PM  To reach On-call, see care teams to locate the attending and reach out via [email or web address]. Between 7PM-7AM, please contact night-coverage If you still have difficulty reaching the attending provider, please page the [facility] (Director on Call) for [facility] on amion for assistance.

## 2021-07-09 NOTE — Plan of Care (Signed)

## 2021-07-10 NOTE — Progress Notes (Addendum)
[facility] Progress Note  Patient: Robin Morales    IRS:[id number]  DOA: 07/04/2021     Date of Service: the patient was seen and examined on 07/10/2021  Brief hospital course: PMH of  chronic A. fib, CAD, CVA, HTN, HLD.  Presents with abdominal pain found to have acute on chronic cholecystitis. Clinical condition continues to worsen patient remains febrile despite IV Zosyn. Currently plan is comfort care.  Subjective: Frustrated.  Appears in respiratory distress.  Less interactive today.  No nausea no vomiting.  Reports constipation.  Assessment and Plan: 1.  Sepsis, present on admission secondary to acute on chronic cholecystitis Met SIRS criteria on admission with tachycardia, fever Currently conservative measures for cholecystitis. Family refuses surgical intervention and patient will not be a good candidate for any intervention regardless. Initially on IV ceftriaxone.   Due to persistent fever patient was switched to IV Zosyn.   With improvement in leukocytosis as well as LFT now. Clinically patient appears to be worsening with continuous fever. Family decided transition to comfort care.  2.  Acute metabolic encephalopathy Secondary to sepsis. Mentation worsening again on 7/14.  Patient unable to follow any commands.  Nonverbal.  Not withdrawing to painful stimuli. Now comfort care.  3.  Chronic A. Fib with RVR Currently rate controlled. Patient was treated with IV metoprolol on a scheduled basis.  Now comfort care. On Eliquis.  Now comfort care.  4.  HTN Blood pressure soft. Monitor.  5.  Dysphagia Likely secondary to lethargy from encephalopathy in the setting of sepsis. Also has dementia. Speech therapy was consulted. Patient was able to tolerate liquid diet. Currently comfort care.  Comfort feed.  6.  GERD Was on PPI.  7. Goal of care D/w two daughters  Presented with acute kidney injury on chronic kidney disease secondary to cholecystitis.  Appears  to be doing poorly with persistent fever and tachycardia. Mentation worsening now with nonverbal and unable to follow any commands. Discussed with family.  Currently agreeable to transition to complete comfort.  Will monitor.  Referred to residential hospice. 7/16 extensive discussion with the rest of the family member.  They had multiple questions regarding goals of care as well as patient's current prognosis and status. Informed that the patient remains at risk for poor prognosis and a good candidate for hospice. Family currently agrees to continue comfort care.  Monitor. Based on 7/17 evaluation prognosis remains less than 2 weeks given respiratory distress and confusion.  8.  Chronic kidney disease stage IV. Acute urinary retention Baseline serum creatinine around 2.0. On admission serum creatinine was 1.27. Currently worsening to 1.61. Unsure whether this will be an acute kidney injury or not with the patient does have a significant 0.3 Mg per DL increase in creatinine. Monitor renal output. Foley catheter.  10.  Elevated LFT. Improving with hydration and treatment. Monitor.  11.  Constipation. Will add bowel regimen.  Scheduled Meds:  chlorhexidine  15 mL Mouth Rinse BID   mouth rinse  15 mL Mouth Rinse BID   mouth rinse  15 mL Mouth Rinse q12n4p   metoprolol tartrate  25 mg Oral BID   risperiDONE  0.25 mg Oral BID   Continuous Infusions:   PRN Meds: acetaminophen **OR** acetaminophen, diphenhydrAMINE, glycopyrrolate **OR** glycopyrrolate **OR** glycopyrrolate, haloperidol **OR** haloperidol **OR** haloperidol lactate, HYDROmorphone (DILAUDID) injection, LORazepam **OR** LORazepam **OR** LORazepam, LORazepam, ondansetron **OR** ondansetron (ZOFRAN) IV, oxyCODONE **OR** oxyCODONE  Body mass index is 25.51 kg/m.     DVT Prophylaxis: Comfort  care     Advance goals of care discussion: Pt is DNR.  Comfort care.  Family Communication: family was present at bedside, at  the time of interview.   Physical Exam:  General: Appear in moderate distress, no Rash; Oral Mucosa Clear, moist. no Abnormal Neck Mass Or lumps, Conjunctiva normal  Cardiovascular: S1 and S2 Present, no Murmur, Respiratory: Increased respiratory effort, Bilateral Air entry present and CTA, no Crackles, no wheezes Abdomen: Bowel Sound present, Soft and no tenderness Extremities: no Pedal edema Neurology: alert and unable to follow command today. Anxious. Gait not checked due to patient safety concerns  Vitals:   07/09/21 0000 07/09/21 0918 07/09/21 1959 07/10/21 1006  BP:  (!) 151/59 (!) 144/66 (!) 141/65  Pulse:  96 86 (!) 109  Resp:  $Remo'16 14 18  'kMQXq$ Temp: 98.7 F (37.1 C) 97.8 F (36.6 C) (!) 97.3 F (36.3 C) 98.8 F (37.1 C)  TempSrc: Axillary  Oral   SpO2:  95% 94% 94%  Weight:      Height:        Disposition:  Status is: Inpatient  Remains inpatient appropriate because:IV treatments appropriate due to intensity of illness or inability to take PO and Inpatient level of care appropriate due to severity of illness  Dispo: The patient is from: SNF              Anticipated d/c is to: SNF              Patient currently is not medically stable to d/c.   Difficult to place patient No  Time spent: 35 minutes. I reviewed all nursing notes, pharmacy notes, vitals, pertinent old records. I have discussed plan of care as described above with RN.  Author: Berle Mull, MD [facility] 07/10/2021 6:03 PM  To reach On-call, see care teams to locate the attending and reach out via [email or web address]. Between 7PM-7AM, please contact night-coverage If you still have difficulty reaching the attending provider, please page the [facility] (Director on Call) for [facility] on amion for assistance.

## 2021-07-11 NOTE — Discharge Summary (Signed)
[facility] Discharge Summary   Patient: Robin Morales:[id number]  PCP: Baxter Hire, MD  Date of admission: 07/04/2021   Date of discharge:  07/11/2021     Discharge Diagnoses:  Principal diagnosis Sepsis due to recurrent  cholecystitis  Active Problems:   Sepsis ([place])  Admitted From: home Disposition:  Residential hospice   Recommendations for Outpatient Follow-up:   Discharge Instructions     Increase activity slowly   Complete by: As directed    No wound care   Complete by: As directed        Diet recommendation: Cardiac diet  Activity: The patient is advised to gradually reintroduce usual activities, as tolerated  Discharge Condition: stable  Code Status: DNR   History of present illness: As per the H and P dictated on admission, "Robin Morales is a 87 y.o. female with medical history significant of chronic A. fib, CAD, CVA, HTN, HLD, presented with new onset of abdominal pain and fever.   Patient somnolent, most history provided by patient daughter at bedside.  Patient has not had history of recurrent cholecystitis had 2-3 episodes last 3 years.  We will treated conservatively.  Today, at lunchtime, patient took 2 bites of barbecue then started to feel started onset of right-sided abdominal pain 10/10.  No throwing up or diarrhea.  Daughter has been checking patient temperature every day since last year for recurrent UTI but this morning her temperature was 97.2 at home. ED Course: Blood pressure borderline low, fever 102.9, CT abdomen compatible with acute on chronic cholecystitis.  Blood work AST 310, ALT at 99, bilirubin 1.7, WBC 11.5.  ED physician to patient family, patient is DNR/DNI, do not wish to have any invasive procedures."  Hospital Course:  Summary of her active problems in the hospital is as following. 1.  Sepsis, present on admission secondary to acute on chronic cholecystitis Met SIRS criteria on admission with tachycardia,  fever Currently conservative measures for cholecystitis. Family refuses surgical intervention and patient will not be a good candidate for any intervention regardless. Initially on IV ceftriaxone.   Due to persistent fever patient was switched to IV Zosyn.   With improvement in leukocytosis as well as LFT now. Clinically patient appears to be worsening with continuous fever. Family decided transition to comfort care.   2.  Acute metabolic encephalopathy Secondary to sepsis. Mentation worsening again on 7/14.  Patient unable to follow any commands.  Nonverbal.  Not withdrawing to painful stimuli. Now comfort care.   3.  Chronic A. Fib with RVR Currently rate controlled. Patient was treated with IV metoprolol on a scheduled basis.  Now comfort care. On Eliquis.  Now comfort care.   4.  HTN Blood pressure soft. Monitor.   5.  Dysphagia Likely secondary to lethargy from encephalopathy in the setting of sepsis. Also has dementia. Speech therapy was consulted. Patient was able to tolerate liquid diet. Currently comfort care.  Comfort feed.   6.  GERD Was on PPI.   7. Goal of care D/w two daughters Presented with acute kidney injury on chronic kidney disease secondary to cholecystitis.  Appears to be doing poorly with persistent fever and tachycardia. Mentation worsening now with nonverbal and unable to follow any commands. Discussed with family.  Currently agreeable to transition to complete comfort.  Will monitor.  Referred to residential hospice. 7/16 extensive discussion with the rest of the family member.  They had multiple questions regarding goals of care as well as patient's  current prognosis and status. Informed that the patient remains at risk for poor prognosis and a good candidate for hospice. Family currently agrees to continue comfort care.  Monitor. Based on 7/17 evaluation prognosis remains less than 2 weeks given intermittent respiratory distress and confusion.    8.  Chronic kidney disease stage IV. Acute urinary retention Baseline serum creatinine around 2.0. On admission serum creatinine was 1.27. Currently worsening to 1.61. Unsure whether this will be an acute kidney injury or not with the patient does have a significant 0.3 Mg per DL increase in creatinine. Monitor renal output. Foley catheter.   10.  Elevated LFT. Improving with hydration and treatment. Monitor.   11.  Constipation. Will add bowel regimen.  On the day of the discharge the patient's vitals were stable, and no other new acute medical condition were reported. The patient was felt safe to be discharge at Residential hospice.  Consultants: none Procedures: none  DISCHARGE MEDICATION: Allergies as of 07/11/2021       Reactions   Gabapentin    Other reaction(s): Dizziness   Keflex [cephalexin] Itching        Medication List     STOP taking these medications    alendronate 70 MG tablet Commonly known as: FOSAMAX   amLODipine 2.5 MG tablet Commonly known as: NORVASC   apixaban 2.5 MG Tabs tablet Commonly known as: ELIQUIS   atorvastatin 40 MG tablet Commonly known as: LIPITOR   cholecalciferol 25 MCG (1000 UNIT) tablet Commonly known as: VITAMIN D3   CORICIDIN HBP COLD/FLU PO   metoprolol tartrate 50 MG tablet Commonly known as: LOPRESSOR   omeprazole 40 MG capsule Commonly known as: PRILOSEC   risperiDONE 0.25 MG tablet Commonly known as: RISPERDAL   sodium bicarbonate 325 MG tablet   vitamin B-12 1000 MCG tablet Commonly known as: CYANOCOBALAMIN       TAKE these medications    Acetaminophen 325 MG Caps Take 650 mg by mouth every 6 (six) hours as needed for mild pain.        Discharge Exam: Filed Weights   07/04/21 1322 07/04/21 2204  Weight: 70.3 kg 63.3 kg   Vitals:   07/10/21 1006 07/11/21 0541  BP: (!) 141/65 (!) 153/75  Pulse: (!) 109 98  Resp: 18 18  Temp: 98.8 F (37.1 C) 98.9 F (37.2 C)  SpO2: 94% 97%    General: Appear in mild distress, no Rash; Oral Mucosa Clear, moist. no Abnormal Neck Mass Or lumps, Conjunctiva normal  Cardiovascular: S1 and S2 Present, no Murmur, Respiratory: good respiratory effort, Bilateral Air entry present and CTA, no Crackles, no wheezes Abdomen: Bowel Sound present,  Extremities: no Pedal edema Neurology: alert and not oriented to time, place, and person affect appropriate. no new focal deficit Gait not checked due to patient safety concerns   The results of significant diagnostics from this hospitalization (including imaging, microbiology, ancillary and laboratory) are listed below for reference.    Significant Diagnostic Studies: CT Head Wo Contrast  Result Date: 07/04/2021 CLINICAL DATA:  Dementia with mental status altered from baseline. EXAM: CT HEAD WITHOUT CONTRAST TECHNIQUE: Contiguous axial images were obtained from the base of the skull through the vertex without intravenous contrast. COMPARISON:  04/10/2020 FINDINGS: Brain: The brainstem, cerebellum, cerebral peduncles, thalami, basal ganglia, basilar cisterns, and ventricular system appear within normal limits. Periventricular white matter and corona radiata hypodensities favor chronic ischemic microvascular white matter disease. No intracranial hemorrhage, mass lesion, or acute CVA. Vascular: There is atherosclerotic  calcification of the cavernous carotid arteries bilaterally. Skull: Unremarkable Sinuses/Orbits: Unremarkable Other: No supplemental non-categorized findings. IMPRESSION: 1. No acute intracranial findings. 2. Periventricular white matter and corona radiata hypodensities favor chronic ischemic microvascular white matter disease. Electronically Signed   By: Van Clines M.D.   On: 07/04/2021 14:47   DG Chest Port 1 View  Result Date: 07/05/2021 CLINICAL DATA:  Shortness of breath and confusion EXAM: PORTABLE CHEST 1 VIEW COMPARISON:  07/04/2021 FINDINGS: Cardiac shadow is enlarged but  stable. Aortic calcifications are again seen. Lungs are well aerated bilaterally with minimal right basilar atelectasis. No focal confluent infiltrate is seen. No pneumothorax is noted. No acute bony abnormality is seen. IMPRESSION: Minimal right basilar atelectasis. Electronically Signed   By: Inez Catalina M.D.   On: 07/05/2021 17:08   DG Chest Port 1 View  Result Date: 07/04/2021 CLINICAL DATA:  Altered mental status, possible sepsis. EXAM: PORTABLE CHEST 1 VIEW COMPARISON:  12/06/2018. FINDINGS: Trachea is midline. Heart size stable. Thoracic aorta is calcified. Mild interstitial prominence and indistinctness bilaterally. Possible trace left pleural fluid. Right costophrenic angle is not included in its entirety. IMPRESSION: 1. Question mild pulmonary edema and tiny left pleural effusion. 2.  Aortic atherosclerosis (ICD10-I70.0). Electronically Signed   By: Lorin Picket M.D.   On: 07/04/2021 14:02   US ABDOMEN LIMITED RUQ (LIVER/GB)  Result Date: 07/04/2021 CLINICAL DATA:  Transaminitis, type II diabetes mellitus, coronary artery disease post MI, hypertension. Past history of emphysematous cholecystitis EXAM: ULTRASOUND ABDOMEN LIMITED RIGHT UPPER QUADRANT COMPARISON:  CT abdomen and pelvis 12/06/2018 FINDINGS: Gallbladder: Large calculus 20 mm diameter within contracted gallbladder. Associated gallbladder wall thickening. Minimal pericholecystic fluid. No sonographic Murphy sign. Common bile duct: Diameter: 11 mm, measured 7 mm diameter on prior CT Liver: No hepatic mass or nodularity. Normal echogenicity. No definite intrahepatic biliary dilatation. Portal vein is patent on color Doppler imaging with normal direction of blood flow towards the liver. Other: No RIGHT upper quadrant free fluid. IMPRESSION: Cholelithiasis with chronic gallbladder wall thickening and minimal pericholecystic fluid though no definite sonographic Percell Miller sign is identified. This could reflect acute or chronic cholecystitis.  Biliary dilatation, CBD 11 mm increased from previous exam. Electronically Signed   By: Lavonia Dana M.D.   On: 07/04/2021 16:30    Microbiology: Recent Results (from the past 240 hour(s))  Resp Panel by RT-PCR (Flu A&B, Covid) Nasopharyngeal Swab     Status: None   Collection Time: 07/04/21  1:46 PM   Specimen: Nasopharyngeal Swab; Nasopharyngeal(NP) swabs in vial transport medium  Result Value Ref Range Status   SARS Coronavirus 2 by RT PCR NEGATIVE NEGATIVE Final    Comment: (NOTE) SARS-CoV-2 target nucleic acids are NOT DETECTED.  The SARS-CoV-2 RNA is generally detectable in upper respiratory specimens during the acute phase of infection. The lowest concentration of SARS-CoV-2 viral copies this assay can detect is 138 copies/mL. A negative result does not preclude SARS-Cov-2 infection and should not be used as the sole basis for treatment or other patient management decisions. A negative result may occur with  improper specimen collection/handling, submission of specimen other than nasopharyngeal swab, presence of viral mutation(s) within the areas targeted by this assay, and inadequate number of viral copies(<138 copies/mL). A negative result must be combined with clinical observations, patient history, and epidemiological information. The expected result is Negative.  Fact Sheet for Patients:  EntrepreneurPulse.com.au  Fact Sheet for Healthcare Providers:  IncredibleEmployment.be  This test is no t yet approved or cleared by the  [place] Architectural technologist and  has been authorized for detection and/or diagnosis of SARS-CoV-2 by FDA under an Print production planner (EUA). This EUA will remain  in effect (meaning this test can be used) for the duration of the COVID-19 declaration under Section 564(b)(1) of the Act, 21 U.S.C.section 360bbb-3(b)(1), unless the authorization is terminated  or revoked sooner.       Influenza A by PCR NEGATIVE  NEGATIVE Final   Influenza B by PCR NEGATIVE NEGATIVE Final    Comment: (NOTE) The Xpert Xpress SARS-CoV-2/FLU/RSV plus assay is intended as an aid in the diagnosis of influenza from Nasopharyngeal swab specimens and should not be used as a sole basis for treatment. Nasal washings and aspirates are unacceptable for Xpert Xpress SARS-CoV-2/FLU/RSV testing.  Fact Sheet for Patients: EntrepreneurPulse.com.au  Fact Sheet for Healthcare Providers: IncredibleEmployment.be  This test is not yet approved or cleared by the [place] FDA and has been authorized for detection and/or diagnosis of SARS-CoV-2 by FDA under an Emergency Use Authorization (EUA). This EUA will remain in effect (meaning this test can be used) for the duration of the COVID-19 declaration under Section 564(b)(1) of the Act, 21 U.S.C. section 360bbb-3(b)(1), unless the authorization is terminated or revoked.  Performed at [facility], [address] [place], [place] [address]   Blood culture (routine single)     Status: None   Collection Time: 07/04/21  1:46 PM   Specimen: BLOOD  Result Value Ref Range Status   Specimen Description BLOOD BLOOD LEFT ARM  Final   Special Requests   Final    BOTTLES DRAWN AEROBIC ONLY Blood Culture results may not be optimal due to an excessive volume of blood received in culture bottles   Culture   Final    NO GROWTH 5 DAYS Performed at [facility], [address] [place], [place] [address]    Report Status 07/09/2021 FINAL  Final     Labs: CBC: Recent Labs  Lab 07/04/21 1346 07/05/21 0439 07/06/21 0451 07/07/21 0433  WBC 11.5* 16.9* 10.1 11.1*  NEUTROABS 10.7* 15.1* 8.9* 9.4*  HGB 12.2 10.5* 9.7* 11.4*  HCT 37.2 33.1* 29.8* 37.0  MCV 89.4 92.5 91.4 91.1  PLT 230 187 160 299   Basic Metabolic Panel: Recent Labs  Lab 07/04/21 1346 07/05/21 0439 07/06/21 0451 07/07/21 0433  NA 141 143 140 140  K 3.7  4.5 3.7 3.6  CL 106 110 107 108  CO2 _0 GLUCOSE 177* 136* 107* 104*  BUN 31* 35* 35* 28*  CREATININE 1.27* 1.39* 1.61* 1.45*  CALCIUM 9.0 8.2* 7.7* 8.3*  MG  --   --  1.2*  --    Liver Function Tests: Recent Labs  Lab 07/04/21 1346 07/05/21 0439 07/06/21 0451 07/07/21 0433  AST 310* 190* 53* 37  ALT 99* 152* 91* 76*  ALKPHOS 259* 193* 151* 165*  BILITOT 1.7* 1.9* 0.9 0.8  PROT 6.5 5.4* 4.7* 5.3*  ALBUMIN 3.4* 2.7* 2.4* 2.7*   CBG: No results for input(s): GLUCAP in the last 168 hours.  Time spent: 35 minutes  Signed:  Berle Mull  [facility]  07/11/2021

## 2021-07-11 NOTE — Plan of Care (Signed)
Pt is comfort care.  Problem: Coping: Goal: Level of anxiety will decrease Outcome: Progressing   Problem: Elimination: Goal: Will not experience complications related to bowel motility Outcome: Progressing Goal: Will not experience complications related to urinary retention Outcome: Progressing   Problem: Pain Managment: Goal: General experience of comfort will improve Outcome: Progressing   Problem: Safety: Goal: Ability to remain free from injury will improve Outcome: Progressing   Problem: Skin Integrity: Goal: Risk for impaired skin integrity will decrease Outcome: Progressing

## 2021-07-11 NOTE — TOC Transition Note (Signed)
Transition of Care [facility]) - CM/SW Discharge Note   Patient Details  Name: Robin Morales MRN: [id number] Date of Birth: Jan 23, 1926  [facility]) CM/SW Contact:  Kerin Salen, RN Phone Number: 07/11/2021, 10:37 AM     Final next level of care: [place] Barriers to Discharge: Continued Medical Work up   Patient Goals and CMS Choice Patient states their goals for this hospitalization and ongoing recovery are:: To be able to walk with walker with minimal assist at home      Discharge Placement                Patient to be transferred to facility by: [facility] Name of family member notified: Hospice notified family Patient and family notified of of transfer: 07/11/21  Discharge Plan and Services   Discharge Planning Services: CM Consult Post Acute Care Choice: Home Health          DME Arranged: Other see comment (Arranged by [facility])                    Social Determinants of Health (SDOH) Interventions     Readmission Risk Interventions Readmission Risk Prevention Plan 07/08/2021 12/08/2018  Transportation Screening Complete Complete  PCP or Specialist Appt within 5-7 Days Complete Complete  Home Care Screening Complete Complete  Medication Review (RN CM) Complete Complete  Some recent data might be hidden

## 2021-07-11 NOTE — Progress Notes (Signed)
Patient being transported to [facility] via EMS. Per [name] patient is to be transported with IV and Foley catheter in place. Family at bedside.

## 2021-07-11 NOTE — Progress Notes (Signed)
Manufacturing engineer  [facility]) Hospital Liaison note: Robin Morales is able to offer a bed at the hospice home for transfer today.  Writer spoke at bedside with patient's daughter's [name] and [name]. They have accepted the bed. Consents to be signed by [name] via docusign this afternoon at her request. Hospital care team has been updated.  Plan si for discharge to the hospice home today with a 5:00 pm transport set up by TOC [name]. Thank you for the opportunity to be involved in the care of this patient and her family.  [name] BSN, RN, [place] [phone]

## 2021-07-25 DEATH — deceased
